# Patient Record
Sex: Male | Born: 1940 | Race: Black or African American | Hispanic: No
Health system: Southern US, Community
[De-identification: ages and names within clinical notes are randomized; demographics above are authoritative.]

---

## 2003-10-12 ENCOUNTER — Other Ambulatory Visit: Payer: Self-pay

## 2010-05-28 ENCOUNTER — Emergency Department: Payer: Self-pay | Admitting: Emergency Medicine

## 2010-09-18 ENCOUNTER — Emergency Department: Payer: Self-pay | Admitting: Emergency Medicine

## 2011-01-01 ENCOUNTER — Ambulatory Visit: Payer: Self-pay | Admitting: Oncology

## 2011-01-05 ENCOUNTER — Emergency Department: Payer: Self-pay | Admitting: Emergency Medicine

## 2011-01-22 ENCOUNTER — Inpatient Hospital Stay: Payer: Self-pay | Admitting: Internal Medicine

## 2011-01-31 ENCOUNTER — Ambulatory Visit: Payer: Self-pay | Admitting: Oncology

## 2011-02-04 DIAGNOSIS — I10 Essential (primary) hypertension: Secondary | ICD-10-CM

## 2011-02-04 DIAGNOSIS — E039 Hypothyroidism, unspecified: Secondary | ICD-10-CM

## 2011-02-04 DIAGNOSIS — E871 Hypo-osmolality and hyponatremia: Secondary | ICD-10-CM

## 2011-02-04 DIAGNOSIS — D649 Anemia, unspecified: Secondary | ICD-10-CM

## 2011-03-03 ENCOUNTER — Ambulatory Visit: Payer: Self-pay | Admitting: Oncology

## 2011-05-12 ENCOUNTER — Ambulatory Visit: Payer: Medicare Other | Admitting: Internal Medicine

## 2011-10-01 ENCOUNTER — Ambulatory Visit: Payer: Self-pay | Admitting: Ophthalmology

## 2011-10-01 DIAGNOSIS — I499 Cardiac arrhythmia, unspecified: Secondary | ICD-10-CM

## 2011-10-01 LAB — HEMOGLOBIN: HGB: 11.4 g/dL — ABNORMAL LOW (ref 13.0–18.0)

## 2011-10-12 ENCOUNTER — Ambulatory Visit: Payer: Self-pay | Admitting: Ophthalmology

## 2011-10-26 ENCOUNTER — Other Ambulatory Visit: Payer: Medicare Other

## 2011-11-24 ENCOUNTER — Other Ambulatory Visit: Payer: Medicare Other

## 2012-01-19 ENCOUNTER — Emergency Department: Payer: Self-pay | Admitting: Emergency Medicine

## 2012-01-19 LAB — COMPREHENSIVE METABOLIC PANEL
Albumin: 3.9 g/dL (ref 3.4–5.0)
Anion Gap: 13 (ref 7–16)
BUN: 18 mg/dL (ref 7–18)
Bilirubin,Total: 2.3 mg/dL — ABNORMAL HIGH (ref 0.2–1.0)
Co2: 22 mmol/L (ref 21–32)
Creatinine: 1.24 mg/dL (ref 0.60–1.30)
EGFR (Non-African Amer.): 59 — ABNORMAL LOW
Glucose: 187 mg/dL — ABNORMAL HIGH (ref 65–99)
SGPT (ALT): 11 U/L — ABNORMAL LOW
Sodium: 132 mmol/L — ABNORMAL LOW (ref 136–145)
Total Protein: 7.6 g/dL (ref 6.4–8.2)

## 2012-01-19 LAB — URINALYSIS, COMPLETE
Bacteria: NONE SEEN
Blood: NEGATIVE
Leukocyte Esterase: NEGATIVE
Nitrite: NEGATIVE
Ph: 5 (ref 4.5–8.0)
Protein: NEGATIVE
Squamous Epithelial: 1
WBC UR: <1 /HPF (ref 0–5)

## 2012-01-19 LAB — CBC
HCT: 35.7 % — ABNORMAL LOW (ref 40.0–52.0)
MCHC: 35.6 g/dL (ref 32.0–36.0)
Platelet: 161 10*3/uL (ref 150–440)
RBC: 4.11 10*6/uL — ABNORMAL LOW (ref 4.40–5.90)
RDW: 14.4 % (ref 11.5–14.5)
WBC: 6.5 10*3/uL (ref 3.8–10.6)

## 2012-09-02 ENCOUNTER — Ambulatory Visit: Payer: Self-pay | Admitting: Internal Medicine

## 2012-09-26 ENCOUNTER — Inpatient Hospital Stay: Payer: Self-pay | Admitting: Internal Medicine

## 2012-09-26 LAB — CBC
HCT: 41 % (ref 40.0–52.0)
MCH: 30.4 pg (ref 26.0–34.0)
MCV: 88 fL (ref 80–100)
Platelet: 285 10*3/uL (ref 150–440)
RBC: 4.65 10*6/uL (ref 4.40–5.90)
RDW: 14 % (ref 11.5–14.5)

## 2012-09-26 LAB — COMPREHENSIVE METABOLIC PANEL
Albumin: 4 g/dL (ref 3.4–5.0)
Anion Gap: 20 — ABNORMAL HIGH (ref 7–16)
Bilirubin,Total: 2.4 mg/dL — ABNORMAL HIGH (ref 0.2–1.0)
Calcium, Total: 9.7 mg/dL (ref 8.5–10.1)
Chloride: 97 mmol/L — ABNORMAL LOW (ref 98–107)
Co2: 16 mmol/L — ABNORMAL LOW (ref 21–32)
EGFR (African American): 31 — ABNORMAL LOW
EGFR (Non-African Amer.): 26 — ABNORMAL LOW
Glucose: 80 mg/dL (ref 65–99)
Osmolality: 276 (ref 275–301)
SGOT(AST): 68 U/L — ABNORMAL HIGH (ref 15–37)
Sodium: 133 mmol/L — ABNORMAL LOW (ref 136–145)

## 2012-09-26 LAB — TROPONIN I: Troponin-I: <0.02 ng/mL

## 2012-09-26 LAB — APTT: Activated PTT: 34.1 secs (ref 23.6–35.9)

## 2012-09-26 LAB — MAGNESIUM: Magnesium: 2.1 mg/dL

## 2012-09-27 LAB — CBC WITH DIFFERENTIAL/PLATELET
Basophil #: 0 10*3/uL (ref 0.0–0.1)
Basophil %: 0.1 %
HCT: 38.3 % — ABNORMAL LOW (ref 40.0–52.0)
Lymphocyte #: 0.2 10*3/uL — ABNORMAL LOW (ref 1.0–3.6)
MCH: 30 pg (ref 26.0–34.0)
MCHC: 34.8 g/dL (ref 32.0–36.0)
Monocyte #: 0.6 x10 3/mm (ref 0.2–1.0)
Monocyte %: 4.1 %
Neutrophil #: 13.1 10*3/uL — ABNORMAL HIGH (ref 1.4–6.5)
Platelet: 195 10*3/uL (ref 150–440)
RBC: 4.44 10*6/uL (ref 4.40–5.90)
RDW: 14.2 % (ref 11.5–14.5)
WBC: 13.9 10*3/uL — ABNORMAL HIGH (ref 3.8–10.6)

## 2012-09-27 LAB — URINALYSIS, COMPLETE
Glucose,UR: NEGATIVE mg/dL (ref 0–75)
Granular Cast: 5
Hyaline Cast: 3
Ketone: NEGATIVE
Nitrite: NEGATIVE
Protein: 30
Specific Gravity: 1.011 (ref 1.003–1.030)
Squamous Epithelial: <1
WBC UR: NONE SEEN /HPF (ref 0–5)

## 2012-09-27 LAB — COMPREHENSIVE METABOLIC PANEL
Albumin: 3.4 g/dL (ref 3.4–5.0)
Alkaline Phosphatase: 90 U/L (ref 50–136)
BUN: 55 mg/dL — ABNORMAL HIGH (ref 7–18)
Calcium, Total: 7.9 mg/dL — ABNORMAL LOW (ref 8.5–10.1)
Chloride: 105 mmol/L (ref 98–107)
Co2: 15 mmol/L — ABNORMAL LOW (ref 21–32)
Creatinine: 2.83 mg/dL — ABNORMAL HIGH (ref 0.60–1.30)
EGFR (African American): 25 — ABNORMAL LOW
EGFR (Non-African Amer.): 22 — ABNORMAL LOW
Glucose: 108 mg/dL — ABNORMAL HIGH (ref 65–99)
Osmolality: 289 (ref 275–301)
Potassium: 4.7 mmol/L (ref 3.5–5.1)
SGOT(AST): 173 U/L — ABNORMAL HIGH (ref 15–37)
SGPT (ALT): 38 U/L (ref 12–78)
Sodium: 137 mmol/L (ref 136–145)
Total Protein: 6.6 g/dL (ref 6.4–8.2)

## 2012-09-27 LAB — PROTIME-INR
INR: 1.4
Prothrombin Time: 17.1 secs — ABNORMAL HIGH (ref 11.5–14.7)

## 2012-09-27 LAB — APTT: Activated PTT: 116.9 secs — ABNORMAL HIGH (ref 23.6–35.9)

## 2012-09-27 LAB — TSH: Thyroid Stimulating Horm: 3.75 u[IU]/mL

## 2012-09-27 LAB — MAGNESIUM: Magnesium: 1.9 mg/dL

## 2012-09-27 LAB — PHOSPHORUS: Phosphorus: 5.6 mg/dL — ABNORMAL HIGH (ref 2.5–4.9)

## 2012-09-27 LAB — CK: CK, Total: 1836 U/L — ABNORMAL HIGH (ref 35–232)

## 2012-09-28 LAB — CBC WITH DIFFERENTIAL/PLATELET
Basophil #: 0 10*3/uL (ref 0.0–0.1)
Basophil %: 0.3 %
Eosinophil #: 0 10*3/uL (ref 0.0–0.7)
Eosinophil %: 0 %
HCT: 35.5 % — ABNORMAL LOW (ref 40.0–52.0)
HGB: 12.3 g/dL — ABNORMAL LOW (ref 13.0–18.0)
Lymphocyte #: 0.1 10*3/uL — ABNORMAL LOW (ref 1.0–3.6)
Lymphocyte %: 1.1 %
MCH: 30 pg (ref 26.0–34.0)
MCHC: 34.6 g/dL (ref 32.0–36.0)
MCV: 87 fL (ref 80–100)
Monocyte #: 0.6 x10 3/mm (ref 0.2–1.0)
Monocyte %: 4.3 %
Neutrophil #: 12.4 10*3/uL — ABNORMAL HIGH (ref 1.4–6.5)
Neutrophil %: 94.3 %
Platelet: 157 10*3/uL (ref 150–440)
RBC: 4.09 10*6/uL — ABNORMAL LOW (ref 4.40–5.90)
RDW: 14.3 % (ref 11.5–14.5)
WBC: 13.2 10*3/uL — ABNORMAL HIGH (ref 3.8–10.6)

## 2012-09-28 LAB — COMPREHENSIVE METABOLIC PANEL
Albumin: 2.8 g/dL — ABNORMAL LOW (ref 3.4–5.0)
Alkaline Phosphatase: 75 U/L (ref 50–136)
Anion Gap: 13 (ref 7–16)
BUN: 49 mg/dL — ABNORMAL HIGH (ref 7–18)
Bilirubin,Total: 0.7 mg/dL (ref 0.2–1.0)
Calcium, Total: 7.4 mg/dL — ABNORMAL LOW (ref 8.5–10.1)
Chloride: 104 mmol/L (ref 98–107)
Co2: 19 mmol/L — ABNORMAL LOW (ref 21–32)
Creatinine: 2.25 mg/dL — ABNORMAL HIGH (ref 0.60–1.30)
EGFR (African American): 33 — ABNORMAL LOW
EGFR (Non-African Amer.): 28 — ABNORMAL LOW
Glucose: 175 mg/dL — ABNORMAL HIGH (ref 65–99)
Osmolality: 289 (ref 275–301)
Potassium: 4 mmol/L (ref 3.5–5.1)
SGOT(AST): 87 U/L — ABNORMAL HIGH (ref 15–37)
SGPT (ALT): 32 U/L (ref 12–78)
Sodium: 136 mmol/L (ref 136–145)
Total Protein: 6.3 g/dL — ABNORMAL LOW (ref 6.4–8.2)

## 2012-09-28 LAB — APTT
Activated PTT: 67.6 secs — ABNORMAL HIGH (ref 23.6–35.9)
Activated PTT: 32.8 secs (ref 23.6–35.9)

## 2012-09-28 LAB — CLOSTRIDIUM DIFFICILE BY PCR

## 2012-09-28 LAB — HEMATOCRIT: HCT: 35 % — ABNORMAL LOW (ref 40.0–52.0)

## 2012-09-29 LAB — CBC WITH DIFFERENTIAL/PLATELET
Basophil #: 0 10*3/uL (ref 0.0–0.1)
Basophil %: 0.2 %
Eosinophil #: 0 10*3/uL (ref 0.0–0.7)
Eosinophil %: 0 %
HCT: 28.3 % — ABNORMAL LOW (ref 40.0–52.0)
Lymphocyte #: 0.1 10*3/uL — ABNORMAL LOW (ref 1.0–3.6)
MCH: 30.3 pg (ref 26.0–34.0)
MCHC: 35.9 g/dL (ref 32.0–36.0)
MCV: 84 fL (ref 80–100)
Monocyte %: 5.3 %
Neutrophil #: 8.3 10*3/uL — ABNORMAL HIGH (ref 1.4–6.5)
Neutrophil %: 93.3 %
RDW: 14.3 % (ref 11.5–14.5)
WBC: 8.9 10*3/uL (ref 3.8–10.6)

## 2012-09-29 LAB — HEPATIC FUNCTION PANEL A (ARMC)
Alkaline Phosphatase: 66 U/L (ref 50–136)
Bilirubin, Direct: 0.4 mg/dL — ABNORMAL HIGH (ref 0.00–0.20)
Bilirubin,Total: 0.6 mg/dL (ref 0.2–1.0)
SGOT(AST): 60 U/L — ABNORMAL HIGH (ref 15–37)
SGPT (ALT): 35 U/L (ref 12–78)
Total Protein: 5.8 g/dL — ABNORMAL LOW (ref 6.4–8.2)

## 2012-09-29 LAB — BASIC METABOLIC PANEL
BUN: 42 mg/dL — ABNORMAL HIGH (ref 7–18)
Calcium, Total: 7.5 mg/dL — ABNORMAL LOW (ref 8.5–10.1)
Creatinine: 1.66 mg/dL — ABNORMAL HIGH (ref 0.60–1.30)
EGFR (African American): 48 — ABNORMAL LOW
Glucose: 130 mg/dL — ABNORMAL HIGH (ref 65–99)
Osmolality: 290 (ref 275–301)
Potassium: 3.4 mmol/L — ABNORMAL LOW (ref 3.5–5.1)
Sodium: 139 mmol/L (ref 136–145)

## 2012-09-29 LAB — APTT
Activated PTT: 44.5 secs — ABNORMAL HIGH (ref 23.6–35.9)
Activated PTT: 61.5 secs — ABNORMAL HIGH (ref 23.6–35.9)

## 2012-09-30 ENCOUNTER — Ambulatory Visit: Payer: Self-pay | Admitting: Internal Medicine

## 2012-09-30 LAB — CBC WITH DIFFERENTIAL/PLATELET
Basophil %: 0 %
Eosinophil #: 0 10*3/uL (ref 0.0–0.7)
Eosinophil %: 0 %
HCT: 28.5 % — ABNORMAL LOW (ref 40.0–52.0)
Lymphocyte %: 1.3 %
MCH: 30.8 pg (ref 26.0–34.0)
MCV: 85 fL (ref 80–100)
Monocyte %: 4.3 %
Neutrophil %: 94.4 %
RBC: 3.37 10*6/uL — ABNORMAL LOW (ref 4.40–5.90)
RDW: 14.1 % (ref 11.5–14.5)

## 2012-09-30 LAB — COMPREHENSIVE METABOLIC PANEL
Alkaline Phosphatase: 64 U/L (ref 50–136)
Anion Gap: 9 (ref 7–16)
BUN: 27 mg/dL — ABNORMAL HIGH (ref 7–18)
Bilirubin,Total: 0.7 mg/dL (ref 0.2–1.0)
Calcium, Total: 8 mg/dL — ABNORMAL LOW (ref 8.5–10.1)
Chloride: 109 mmol/L — ABNORMAL HIGH (ref 98–107)
Co2: 22 mmol/L (ref 21–32)
Glucose: 115 mg/dL — ABNORMAL HIGH (ref 65–99)
Osmolality: 285 (ref 275–301)
Potassium: 3.3 mmol/L — ABNORMAL LOW (ref 3.5–5.1)
SGOT(AST): 40 U/L — ABNORMAL HIGH (ref 15–37)
SGPT (ALT): 38 U/L (ref 12–78)
Total Protein: 5.8 g/dL — ABNORMAL LOW (ref 6.4–8.2)

## 2012-09-30 LAB — APTT: Activated PTT: 95.5 secs — ABNORMAL HIGH (ref 23.6–35.9)

## 2012-09-30 LAB — AMMONIA: Ammonia, Plasma: <25 mcmol/L (ref 11–32)

## 2012-10-01 LAB — BASIC METABOLIC PANEL
Co2: 28 mmol/L (ref 21–32)
Creatinine: 1.38 mg/dL — ABNORMAL HIGH (ref 0.60–1.30)
EGFR (African American): 60 — ABNORMAL LOW
EGFR (Non-African Amer.): 51 — ABNORMAL LOW
Osmolality: 293 (ref 275–301)
Sodium: 145 mmol/L (ref 136–145)

## 2012-10-01 LAB — CBC WITH DIFFERENTIAL/PLATELET
Eosinophil %: 0 %
HGB: 10.9 g/dL — ABNORMAL LOW (ref 13.0–18.0)
Lymphocyte %: 6.1 %
MCH: 30.4 pg (ref 26.0–34.0)
MCV: 86 fL (ref 80–100)
Monocyte #: 0.9 x10 3/mm (ref 0.2–1.0)
Neutrophil %: 82.6 %
RDW: 14.4 % (ref 11.5–14.5)
WBC: 8.5 10*3/uL (ref 3.8–10.6)

## 2012-10-01 LAB — APTT
Activated PTT: 120 secs — ABNORMAL HIGH (ref 23.6–35.9)
Activated PTT: 80.7 secs — ABNORMAL HIGH (ref 23.6–35.9)

## 2012-10-02 LAB — CBC WITH DIFFERENTIAL/PLATELET
Basophil #: 0 10*3/uL
Basophil %: 0.1 %
Eosinophil #: 0 10*3/uL
Eosinophil %: 0 %
HCT: 30.8 % — ABNORMAL LOW
HGB: 10.9 g/dL — ABNORMAL LOW
Lymphocyte %: 8.1 %
Lymphs Abs: 0.6 10*3/uL — ABNORMAL LOW
MCH: 30 pg
MCHC: 35.4 g/dL
MCV: 85 fL
Monocyte #: 1.1 10*3/uL — ABNORMAL HIGH
Monocyte %: 14.1 %
Neutrophil #: 6 10*3/uL
Neutrophil %: 77.7 %
Platelet: 181 10*3/uL
RBC: 3.63 x10 6/mm 3 — ABNORMAL LOW
RDW: 14.1 %
WBC: 7.7 10*3/uL

## 2012-10-02 LAB — APTT
Activated PTT: 147.5 secs — ABNORMAL HIGH (ref 23.6–35.9)
Activated PTT: 62.6 s — ABNORMAL HIGH
Activated PTT: 96.9 s — ABNORMAL HIGH

## 2012-10-02 LAB — BASIC METABOLIC PANEL WITH GFR
Anion Gap: 6 — ABNORMAL LOW
BUN: 21 mg/dL — ABNORMAL HIGH
Calcium, Total: 7.8 mg/dL — ABNORMAL LOW
Chloride: 111 mmol/L — ABNORMAL HIGH
Co2: 26 mmol/L
Creatinine: 1.17 mg/dL
EGFR (African American): >60
EGFR (Non-African Amer.): >60
Glucose: 105 mg/dL — ABNORMAL HIGH
Osmolality: 288
Potassium: 3.3 mmol/L — ABNORMAL LOW
Sodium: 143 mmol/L

## 2012-10-02 LAB — CULTURE, BLOOD (SINGLE)

## 2012-10-03 LAB — BASIC METABOLIC PANEL
Anion Gap: 3 — ABNORMAL LOW (ref 7–16)
BUN: 18 mg/dL (ref 7–18)
Calcium, Total: 7.4 mg/dL — ABNORMAL LOW (ref 8.5–10.1)
EGFR (Non-African Amer.): 56 — ABNORMAL LOW
Glucose: 90 mg/dL (ref 65–99)
Osmolality: 283 (ref 275–301)
Sodium: 141 mmol/L (ref 136–145)

## 2012-10-03 LAB — CBC WITH DIFFERENTIAL/PLATELET
Basophil %: 0.3 %
Lymphocyte #: 0.8 10*3/uL — ABNORMAL LOW (ref 1.0–3.6)
MCH: 30 pg (ref 26.0–34.0)
Neutrophil #: 8.2 10*3/uL — ABNORMAL HIGH (ref 1.4–6.5)
RBC: 3.63 10*6/uL — ABNORMAL LOW (ref 4.40–5.90)
RDW: 14.3 % (ref 11.5–14.5)
WBC: 10.3 10*3/uL (ref 3.8–10.6)

## 2012-10-03 LAB — MAGNESIUM: Magnesium: 1.5 mg/dL — ABNORMAL LOW

## 2012-10-03 LAB — APTT: Activated PTT: 143 secs — ABNORMAL HIGH (ref 23.6–35.9)

## 2012-10-04 LAB — HEMATOCRIT: HCT: 31.2 % — ABNORMAL LOW (ref 40.0–52.0)

## 2012-10-04 LAB — BASIC METABOLIC PANEL
BUN: 15 mg/dL (ref 7–18)
Chloride: 107 mmol/L (ref 98–107)
Creatinine: 1.11 mg/dL (ref 0.60–1.30)
Potassium: 3.5 mmol/L (ref 3.5–5.1)

## 2012-10-04 LAB — WBC: WBC: 16.3 10*3/uL — ABNORMAL HIGH (ref 3.8–10.6)

## 2012-10-06 DIAGNOSIS — I10 Essential (primary) hypertension: Secondary | ICD-10-CM

## 2012-10-06 DIAGNOSIS — F068 Other specified mental disorders due to known physiological condition: Secondary | ICD-10-CM

## 2012-10-06 DIAGNOSIS — F05 Delirium due to known physiological condition: Secondary | ICD-10-CM

## 2012-10-06 DIAGNOSIS — E279 Disorder of adrenal gland, unspecified: Secondary | ICD-10-CM

## 2012-10-31 ENCOUNTER — Ambulatory Visit: Payer: Self-pay | Admitting: Internal Medicine

## 2013-01-19 ENCOUNTER — Inpatient Hospital Stay: Payer: Self-pay | Admitting: Family Medicine

## 2013-01-19 LAB — TROPONIN I: Troponin-I: <0.02 ng/mL

## 2013-01-19 LAB — CBC
Platelet: 297 10*3/uL (ref 150–440)
RBC: 3.86 10*6/uL — ABNORMAL LOW (ref 4.40–5.90)
RDW: 15.6 % — ABNORMAL HIGH (ref 11.5–14.5)

## 2013-01-19 LAB — URINALYSIS, COMPLETE
Glucose,UR: NEGATIVE mg/dL (ref 0–75)
Granular Cast: 7
Hyaline Cast: 20
Leukocyte Esterase: NEGATIVE
Nitrite: NEGATIVE
Ph: 5 (ref 4.5–8.0)
Protein: 30
RBC,UR: 1 /HPF (ref 0–5)
WBC UR: 2 /HPF (ref 0–5)

## 2013-01-19 LAB — COMPREHENSIVE METABOLIC PANEL
Albumin: 3.2 g/dL — ABNORMAL LOW (ref 3.4–5.0)
Anion Gap: 10 (ref 7–16)
BUN: 14 mg/dL (ref 7–18)
Bilirubin,Total: 2.1 mg/dL — ABNORMAL HIGH (ref 0.2–1.0)
Calcium, Total: 9.1 mg/dL (ref 8.5–10.1)
Chloride: 101 mmol/L (ref 98–107)
Co2: 23 mmol/L (ref 21–32)
Creatinine: 1.26 mg/dL (ref 0.60–1.30)
EGFR (African American): >60
EGFR (Non-African Amer.): 57 — ABNORMAL LOW
Glucose: 64 mg/dL — ABNORMAL LOW (ref 65–99)
Osmolality: 267 (ref 275–301)
Potassium: 4.1 mmol/L (ref 3.5–5.1)
SGOT(AST): 23 U/L (ref 15–37)
Total Protein: 7.2 g/dL (ref 6.4–8.2)

## 2013-01-19 LAB — CK TOTAL AND CKMB (NOT AT ARMC)
CK, Total: 203 U/L (ref 35–232)
CK-MB: 1.3 ng/mL (ref 0.5–3.6)

## 2013-01-20 LAB — BASIC METABOLIC PANEL
Anion Gap: 3 — ABNORMAL LOW (ref 7–16)
BUN: 13 mg/dL (ref 7–18)
Co2: 27 mmol/L (ref 21–32)
Creatinine: 1.21 mg/dL (ref 0.60–1.30)
EGFR (African American): >60
EGFR (Non-African Amer.): 60 — ABNORMAL LOW
Potassium: 4.4 mmol/L (ref 3.5–5.1)

## 2013-01-20 LAB — TSH: Thyroid Stimulating Horm: 2.71 u[IU]/mL

## 2013-01-21 LAB — BASIC METABOLIC PANEL
Anion Gap: 5 — ABNORMAL LOW (ref 7–16)
BUN: 15 mg/dL (ref 7–18)
Chloride: 108 mmol/L — ABNORMAL HIGH (ref 98–107)
EGFR (Non-African Amer.): >60
Sodium: 141 mmol/L (ref 136–145)

## 2013-01-22 LAB — BASIC METABOLIC PANEL
Anion Gap: 5 — ABNORMAL LOW (ref 7–16)
Chloride: 104 mmol/L (ref 98–107)
Creatinine: 1.03 mg/dL (ref 0.60–1.30)
EGFR (African American): >60
EGFR (Non-African Amer.): >60
Glucose: 77 mg/dL (ref 65–99)
Potassium: 4.4 mmol/L (ref 3.5–5.1)
Sodium: 139 mmol/L (ref 136–145)

## 2013-01-23 LAB — PLATELET COUNT: Platelet: 238 10*3/uL (ref 150–440)

## 2013-01-23 LAB — BASIC METABOLIC PANEL
Anion Gap: 7 (ref 7–16)
BUN: 17 mg/dL (ref 7–18)
Calcium, Total: 8.3 mg/dL — ABNORMAL LOW (ref 8.5–10.1)
Chloride: 103 mmol/L (ref 98–107)
Co2: 28 mmol/L (ref 21–32)
EGFR (African American): >60
EGFR (Non-African Amer.): >60

## 2013-05-21 IMAGING — CT CT HEAD WITHOUT CONTRAST
2 series · 15 of 30 positions shown, 19 images · non-contrast
Comparison: none

REASON FOR EXAM: CONFUSION
COMMENTS:

PROCEDURE:     CT  - CT HEAD WITHOUT CONTRAST  - September 26, 2012  [DATE]
RESULT:     Comparison:  None
TECHNIQUE: Multiple axial images from the foramen magnum to the vertex were
obtained without IV contrast.

[Series 2: without · axial · non-contrast · 0.43mm/px · z∈[-115,+5]mm · 13 of 30 slices shown, 17 images]
[im 3/30  brain]
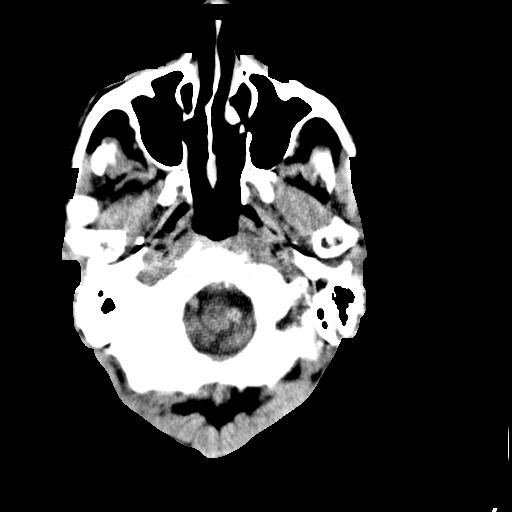
[im 3/30  bone]
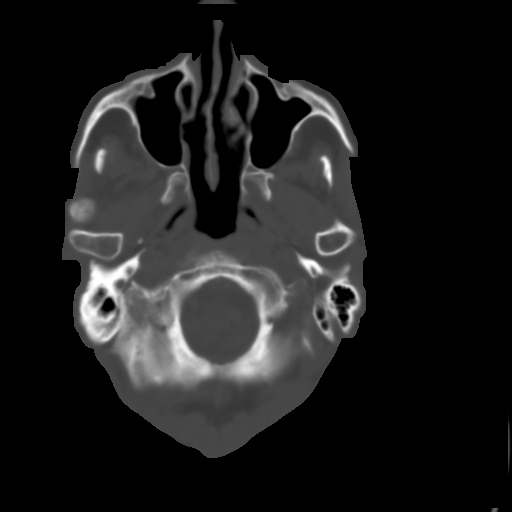
[im 5/30  brain]
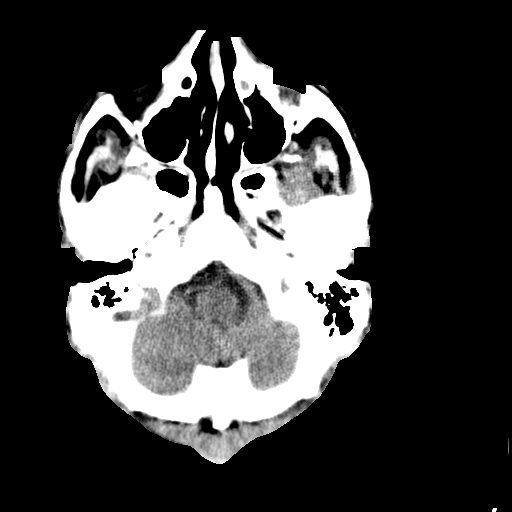
[im 7/30  brain]
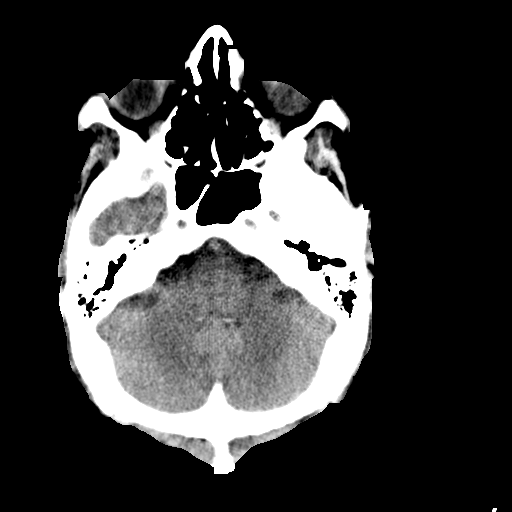
[im 9/30  brain]
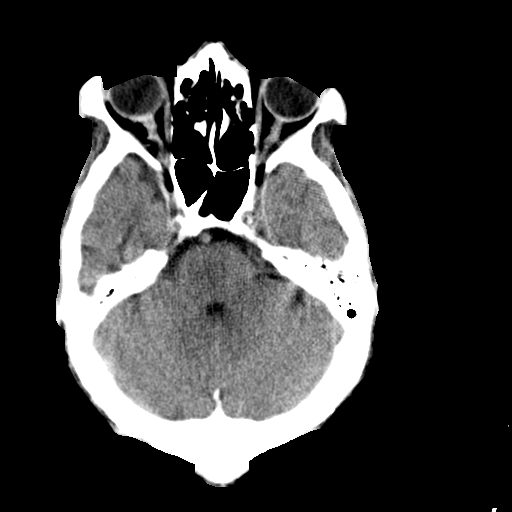
[im 11/30  brain]
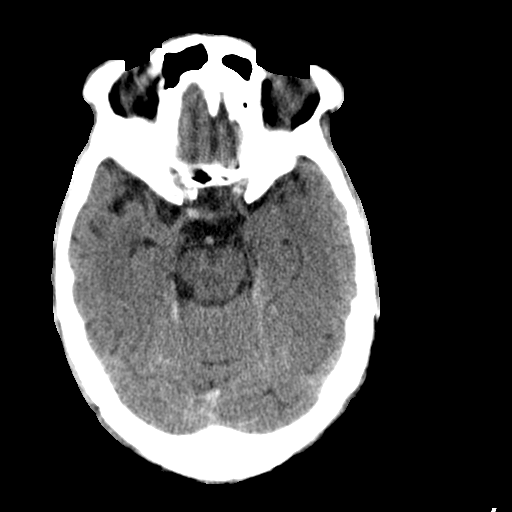
[im 11/30  bone]
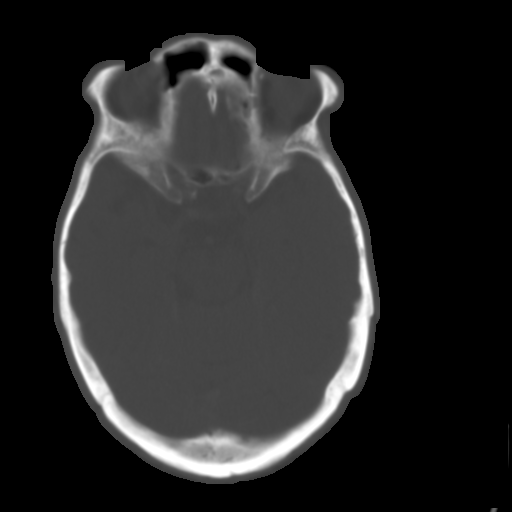
[im 13/30  brain]
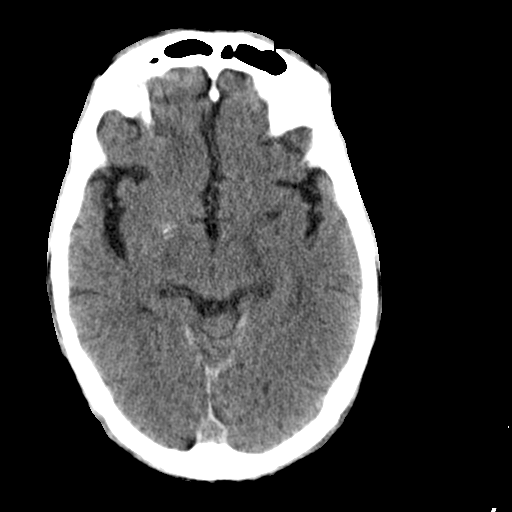
[im 15/30  brain]
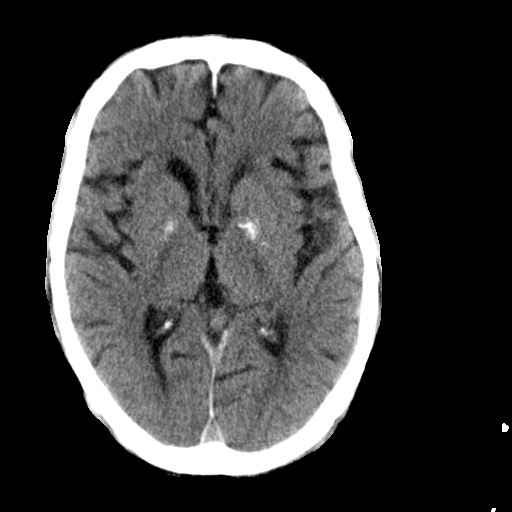
[im 17/30  brain]
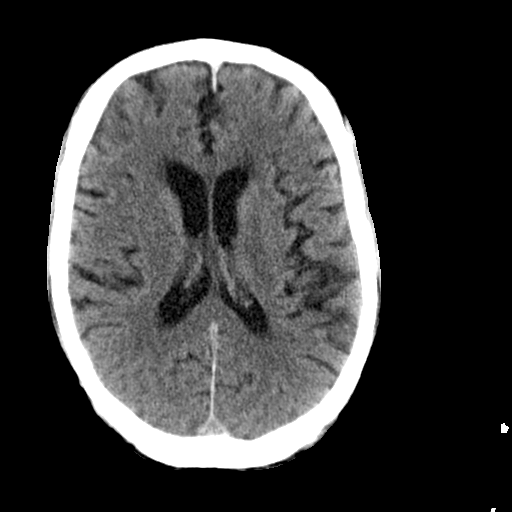
[im 19/30  brain]
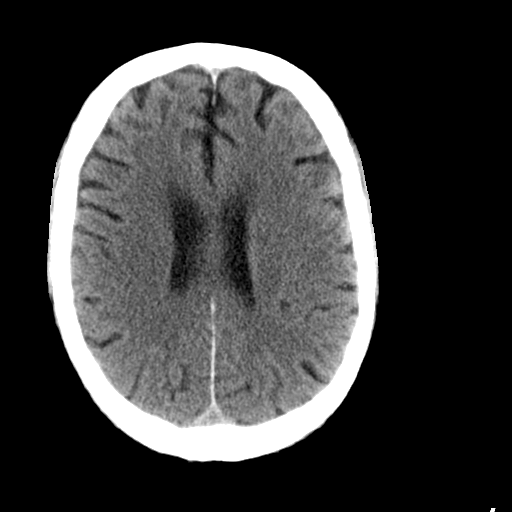
[im 19/30  bone]
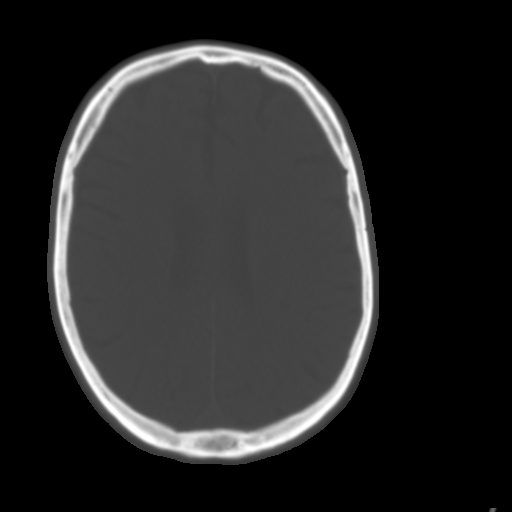
[im 21/30  brain]
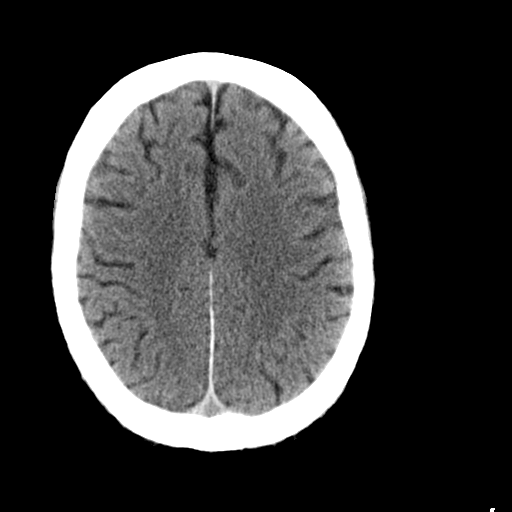
[im 23/30  brain]
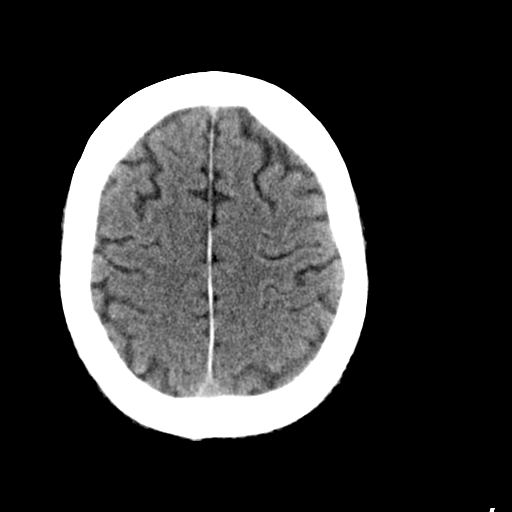
[im 25/30  brain]
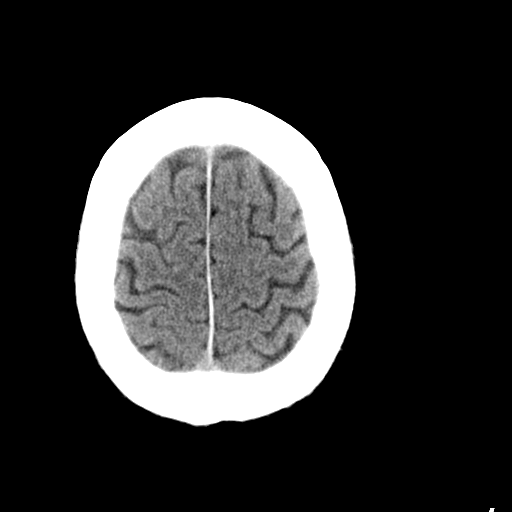
[im 27/30  brain]
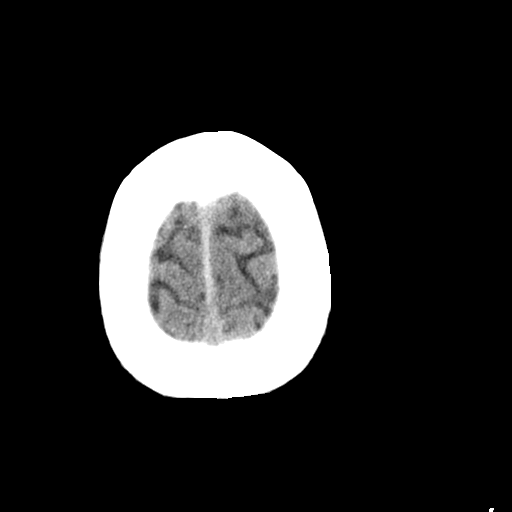
[im 27/30  bone]
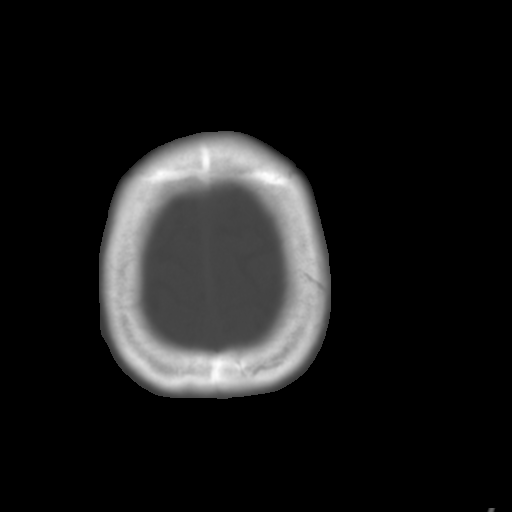

[Series 3: bone · axial · 0.43mm/px · z∈[-115,-95]mm · 2 of 32 slices shown]
[im 3/32  bone]
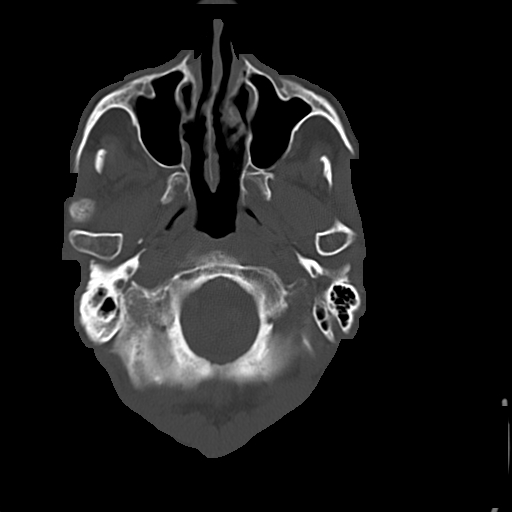
[im 7/32  bone]
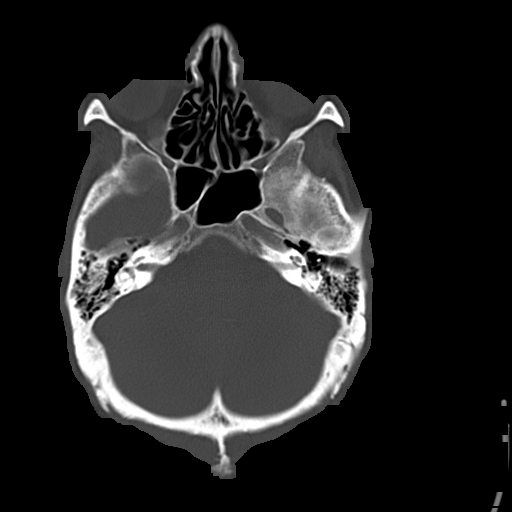

[15 of 30 positions shown; findings below may reference images not displayed]

FINDINGS: There is no evidence of mass effect, midline shift, or extra-axial fluid
collections.  There is no evidence of a space-occupying lesion or
intracranial hemorrhage. There is no evidence of a cortical-based area of
acute infarction. There is generalized cerebral atrophy. There is
periventricular white matter low attenuation likely secondary to
microangiopathy.

The ventricles and sulci are appropriate for the patient's age. The basal
cisterns are patent.

Visualized portions of the orbits are unremarkable. The visualized portions
of the paranasal sinuses and mastoid air cells are unremarkable.
Cerebrovascular atherosclerotic calcifications are noted.

The osseous structures are unremarkable.
IMPRESSION: No acute intracranial process.

[REDACTED]

## 2013-05-25 IMAGING — CR DG ABDOMEN 3V
1 series · 4 of 4 positions shown · non-contrast
Comparison: none

REASON FOR EXAM: ileus on previous films
COMMENTS:

PROCEDURE:     DXR - DXR ABDOMEN COMPLETE  - September 30, 2012  [DATE]
RESULT:     Comparisons:  None

[Series 1: ap · 0.17mm/px · 4 of 4 slices shown]
[im 1/4]
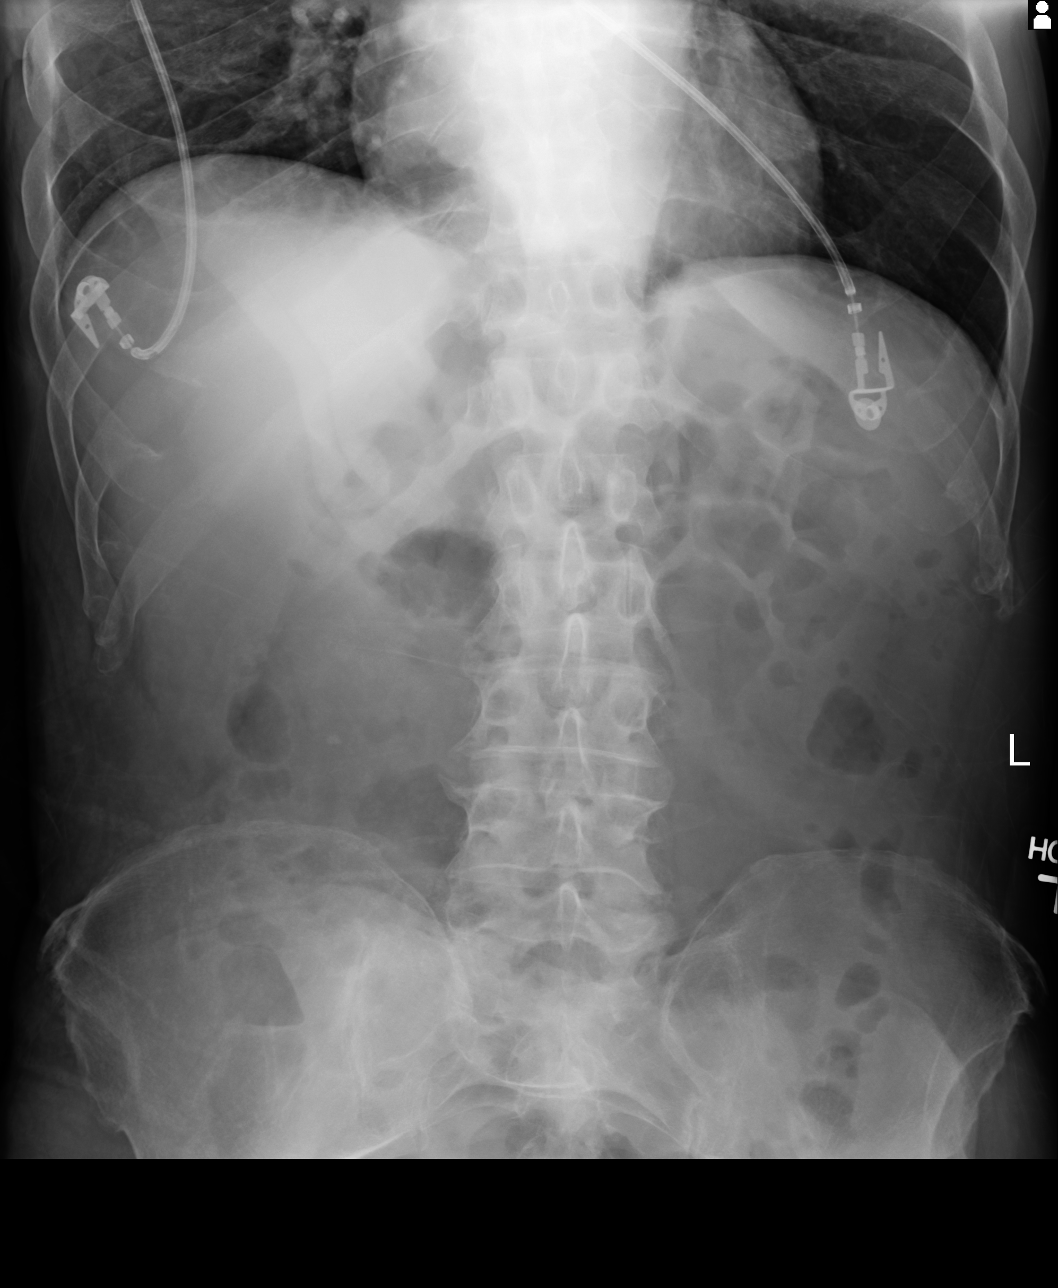
[im 2/4]
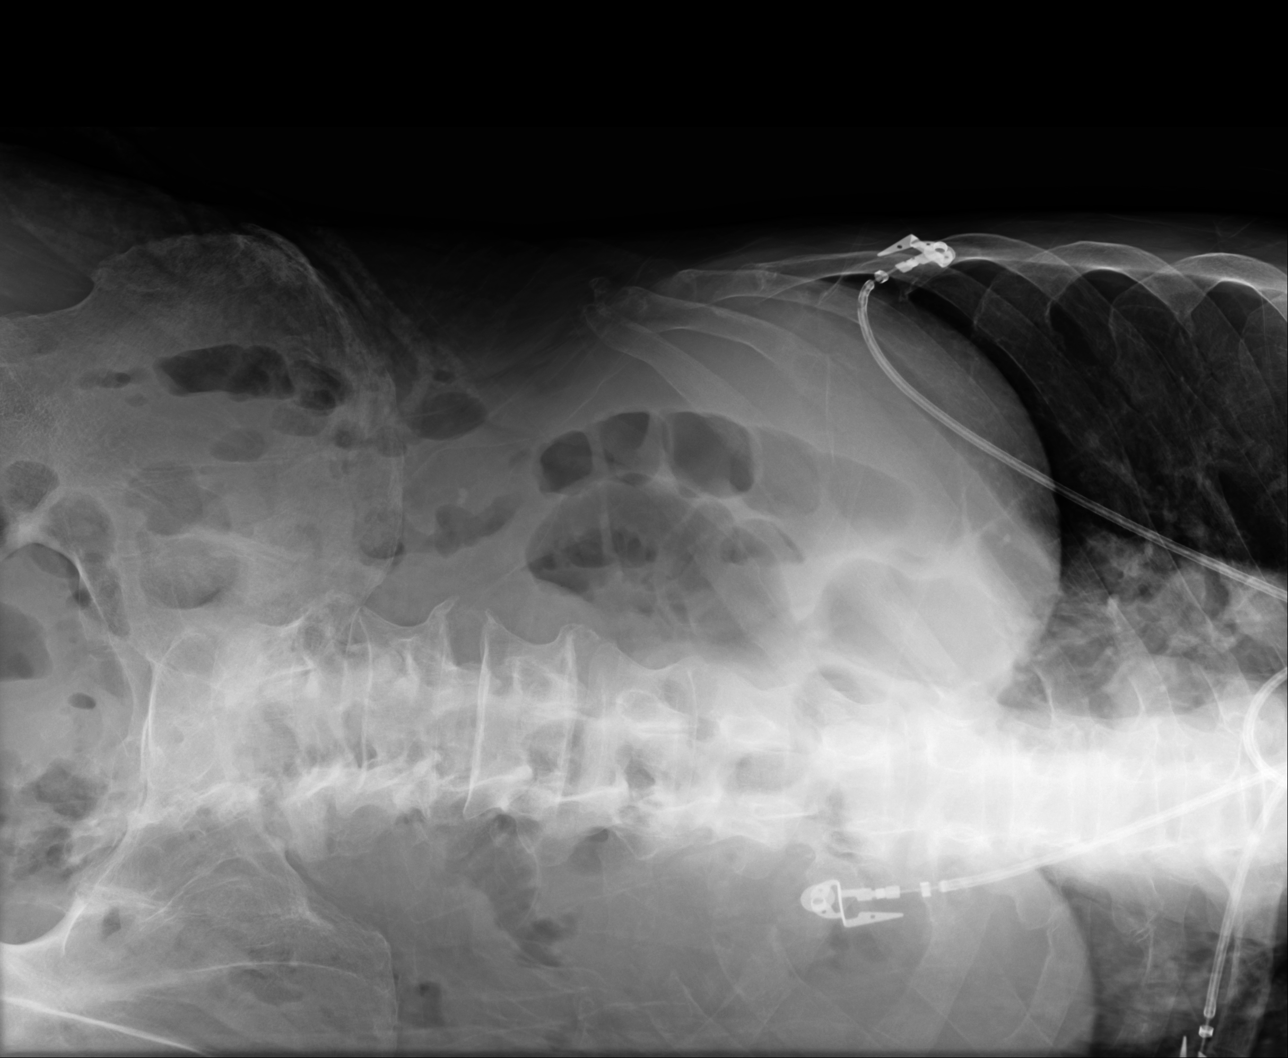
[im 3/4]
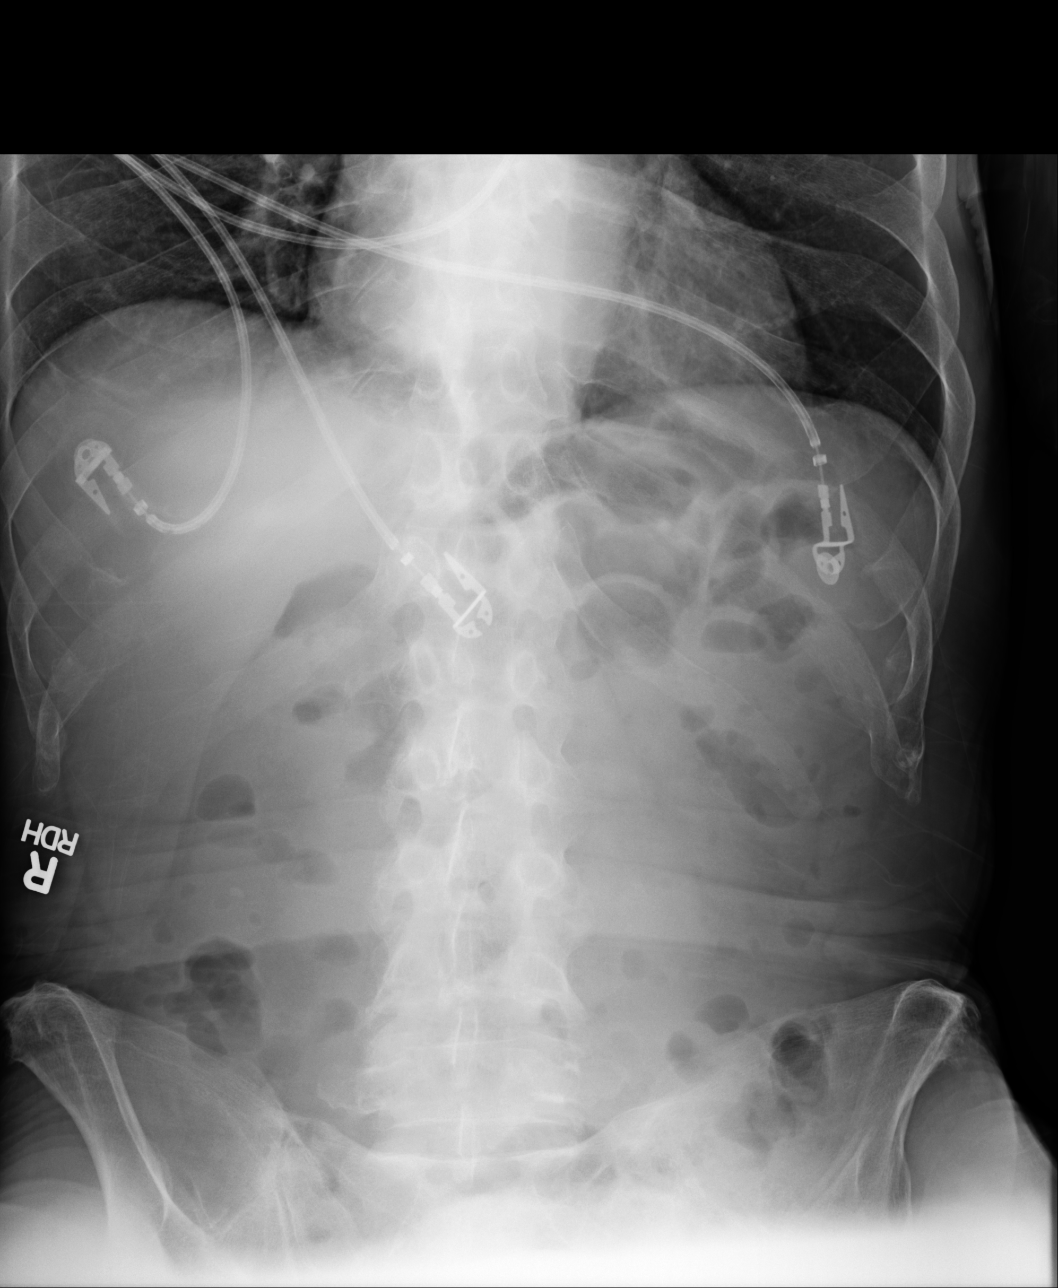
[im 4/4]
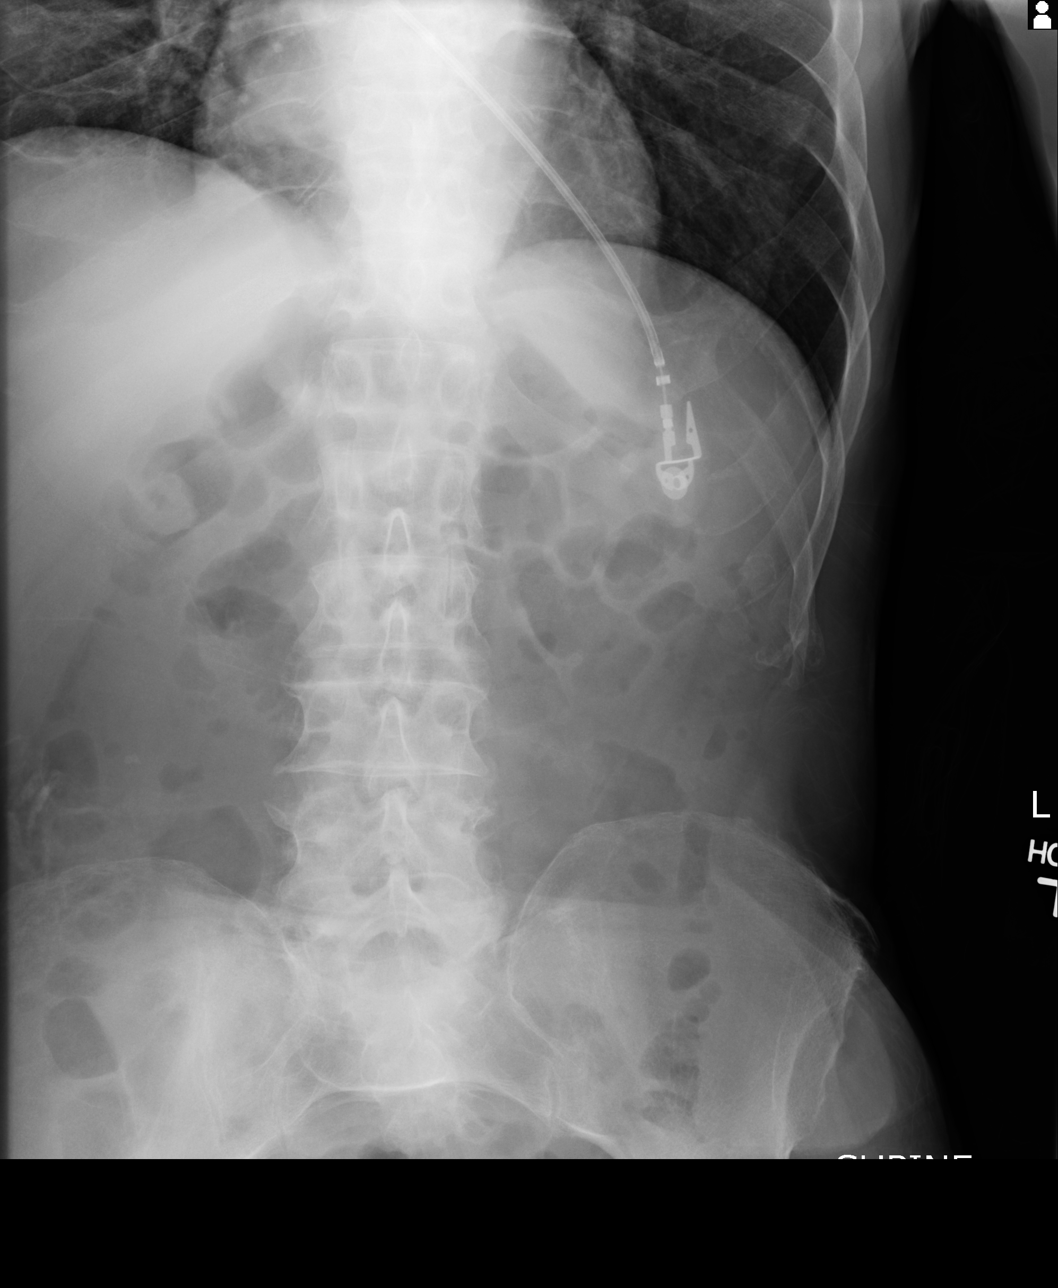

[4 of 4 positions shown; findings below may reference images not displayed]

FINDINGS: Supine, upright, and left lateral decubitus views of the abdomen are
provided.

The chest is clear. The heart and mediastinum are unremarkable. The osseous
structures are unremarkable.

There is a nonspecific bowel gas pattern. There is no bowel dilatation to
suggest obstruction. There are no air-fluid levels. There are no dilated
loops, bowel wall thickening, or evidence of mass effect.  Soft tissue
shadows are normal. There is no evidence of pneumoperitoneum, portal venous
gas, or pneumatosis.

Osseous structures are unremarkable.
IMPRESSION: Unremarkable abdominal series.

[REDACTED]

## 2013-08-16 DIAGNOSIS — E2749 Other adrenocortical insufficiency: Secondary | ICD-10-CM | POA: Diagnosis not present

## 2013-08-30 DIAGNOSIS — E2749 Other adrenocortical insufficiency: Secondary | ICD-10-CM | POA: Diagnosis not present

## 2013-09-13 IMAGING — CR DG CHEST 1V PORT
1 series · 1 of 1 positions shown · non-contrast
Comparison: none

REASON FOR EXAM: weakness
COMMENTS:

[ap]
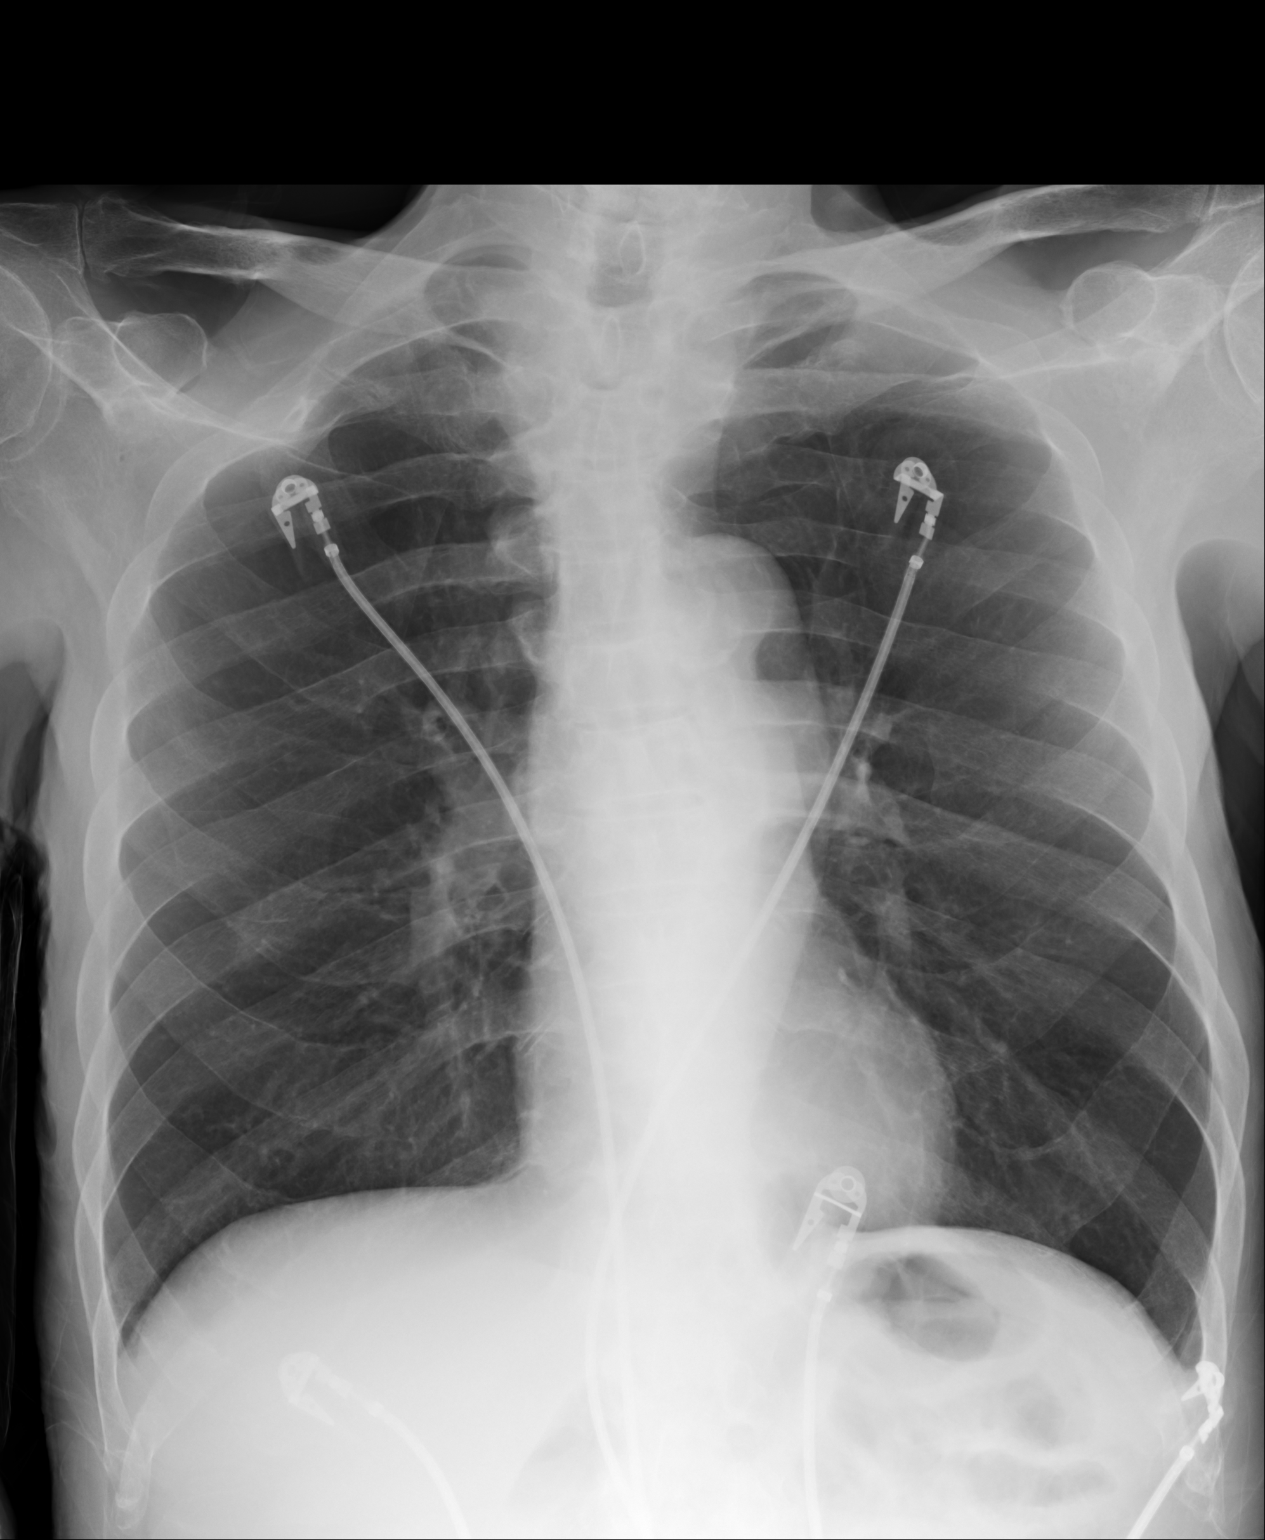

[1 of 1 positions shown; findings below may reference images not displayed]

PROCEDURE:     DXR - DXR PORTABLE CHEST SINGLE VIEW  - January 19, 2013  [DATE]

RESULT:     Comparison is made to the previous examination from 09/26/2012.

The lungs are hyperinflated consistent with COPD. The lungs are clear. The
heart and pulmonary vessels are normal. The bony and mediastinal structures
are unremarkable. There is no effusion. There is no pneumothorax or evidence
of congestive failure.
IMPRESSION: No acute cardiopulmonary disease.

[REDACTED]

## 2013-10-11 DIAGNOSIS — Z136 Encounter for screening for cardiovascular disorders: Secondary | ICD-10-CM | POA: Diagnosis not present

## 2013-10-11 DIAGNOSIS — Z79899 Other long term (current) drug therapy: Secondary | ICD-10-CM | POA: Diagnosis not present

## 2013-10-19 DIAGNOSIS — Z125 Encounter for screening for malignant neoplasm of prostate: Secondary | ICD-10-CM | POA: Diagnosis not present

## 2013-10-19 DIAGNOSIS — Z0000 Encounter for general adult medical examination without abnormal findings: Secondary | ICD-10-CM | POA: Diagnosis not present

## 2013-10-26 DIAGNOSIS — R972 Elevated prostate specific antigen [PSA]: Secondary | ICD-10-CM | POA: Diagnosis not present

## 2013-10-26 DIAGNOSIS — Z79899 Other long term (current) drug therapy: Secondary | ICD-10-CM | POA: Diagnosis not present

## 2013-12-06 DIAGNOSIS — R972 Elevated prostate specific antigen [PSA]: Secondary | ICD-10-CM | POA: Diagnosis not present

## 2013-12-06 DIAGNOSIS — R351 Nocturia: Secondary | ICD-10-CM | POA: Diagnosis not present

## 2013-12-06 DIAGNOSIS — N401 Enlarged prostate with lower urinary tract symptoms: Secondary | ICD-10-CM | POA: Diagnosis not present

## 2013-12-06 DIAGNOSIS — R339 Retention of urine, unspecified: Secondary | ICD-10-CM | POA: Diagnosis not present

## 2014-02-27 DIAGNOSIS — E2749 Other adrenocortical insufficiency: Secondary | ICD-10-CM | POA: Diagnosis not present

## 2014-03-06 DIAGNOSIS — E2749 Other adrenocortical insufficiency: Secondary | ICD-10-CM | POA: Diagnosis not present

## 2014-03-06 DIAGNOSIS — I10 Essential (primary) hypertension: Secondary | ICD-10-CM | POA: Diagnosis not present

## 2014-04-16 DIAGNOSIS — D649 Anemia, unspecified: Secondary | ICD-10-CM | POA: Diagnosis not present

## 2014-04-16 DIAGNOSIS — Z79899 Other long term (current) drug therapy: Secondary | ICD-10-CM | POA: Diagnosis not present

## 2014-04-18 DIAGNOSIS — R972 Elevated prostate specific antigen [PSA]: Secondary | ICD-10-CM | POA: Diagnosis not present

## 2014-04-18 DIAGNOSIS — R339 Retention of urine, unspecified: Secondary | ICD-10-CM | POA: Diagnosis not present

## 2014-04-18 DIAGNOSIS — I10 Essential (primary) hypertension: Secondary | ICD-10-CM | POA: Diagnosis not present

## 2014-04-18 DIAGNOSIS — I509 Heart failure, unspecified: Secondary | ICD-10-CM | POA: Diagnosis not present

## 2014-04-18 DIAGNOSIS — R351 Nocturia: Secondary | ICD-10-CM | POA: Diagnosis not present

## 2014-04-18 DIAGNOSIS — N401 Enlarged prostate with lower urinary tract symptoms: Secondary | ICD-10-CM | POA: Diagnosis not present

## 2014-04-18 DIAGNOSIS — E2749 Other adrenocortical insufficiency: Secondary | ICD-10-CM | POA: Diagnosis not present

## 2014-04-23 DIAGNOSIS — D649 Anemia, unspecified: Secondary | ICD-10-CM | POA: Diagnosis not present

## 2014-04-23 DIAGNOSIS — I10 Essential (primary) hypertension: Secondary | ICD-10-CM | POA: Diagnosis not present

## 2014-07-12 DIAGNOSIS — H2512 Age-related nuclear cataract, left eye: Secondary | ICD-10-CM | POA: Diagnosis not present

## 2014-07-23 DIAGNOSIS — I10 Essential (primary) hypertension: Secondary | ICD-10-CM | POA: Diagnosis not present

## 2014-07-30 DIAGNOSIS — I10 Essential (primary) hypertension: Secondary | ICD-10-CM | POA: Diagnosis not present

## 2014-11-22 NOTE — Discharge Summary (Signed)
PATIENT NAME:  Gary Hahn, Gary Hahn MR#:  [id number] DATE OF BIRTH:  [birth year]  DATE OF ADMISSION:  01/19/2013 DATE OF DISCHARGE:  01/23/2013  DISCHARGE DIAGNOSES: 1.  Adrenal insufficiency.  2.  History of hypertension.  3.  Malnutrition.  4.  Chronic anemia.   DISCHARGE MEDICATIONS: 1.  Hydrocortisone 10 mg p.o. b.i.d.  2.  Ensure Plus 237 mL t.i.d. with meals.   CONSULTANTS:  Endocrinology per Dr. Gabriel Carina.   PROCEDURES:  None.   PERTINENT LABORATORIES:  None.   BRIEF HOSPITAL COURSE:  PROBLEM Adrenal insufficiency.  The patient initially came in with complaints of hyperglycemia and failure-to-thrive who was noted to have underlying adrenal insufficiency, which he has been off his medication for unknown period of time. The last office visit with Dr. Gabriel Carina was in September 2013. He was unable to answer any further questions regarding his treatment and medications. It is suspected that his failure-to-thrive was secondary to anorexia and weakness secondary to the untreated adrenal insufficiency. He was placed back on hydrocortisone and started to improve some.  As far as his malnutrition goes, he was placed on Ensure Plus which he tolerated and liked; therefore, we will continue that as an outpatient.  He does have a history of hypertension. Blood pressure remained stable during his hospital stay, therefore nothing was continued or started at this time.   DISPOSITION:  He is in stable condition and will be discharged to home. I am concerned about his level of compliance and understanding. I asked PT to continue to follow him because of his weakness and also asked nursing to follow him for assessment and also for medication compliance. He will follow up with Dr. Gabriel Carina as directed and follow up with Dr. Netty Starring within 10 days of discharge.     ____________________________ Dion Body, MD kl:ce D: 01/23/2013 12:11:06 ET T: 01/23/2013 12:50:47 ET JOB#: [id number]  cc: Dion Body, MD, <Dictator> Dion Body MD ELECTRONICALLY SIGNED 02/05/2013 8:19

## 2014-11-22 NOTE — Consult Note (Signed)
PATIENT NAME:  Gary Hahn, Gary Hahn MR#:  [id number] DATE OF BIRTH:  03-Feb-1941  DATE OF CONSULTATION:  01/22/2013  REFERRING PHYSICIAN:  Dion Body, MD  CONSULTING PHYSICIAN:  A. Lavone Orn, MD  CHIEF COMPLAINT: Adrenal insufficiency.   HISTORY OF PRESENT ILLNESS: This is a 74 year old male who was admitted on 06/20 with hypoglycemia and complaints of weakness. Initial serum blood sugar was 64 and on finger stick blood sugar he was 17 to 26. The patient was treated with IV dextrose and had improvement in blood sugars. He has known adrenal insufficiency. This was initially diagnosed during a hospitalization in June 2012 in the setting of a hospitalization for hyponatremia and hypothyroidism. Around that time, he had undergone a CT chest and abdomen which showed normal-appearing adrenal glands. He was initiated on thyroid hormone replacement and several weeks later had been brought into the office for a cosyntropin stimulation test, which he failed. His stimulated cortisol increased to only 11.8. He was initiated on hydrocortisone which had been continued, to my knowledge, up until at least September 2013, when he was last seen in the office here. He has subsequently missed follow-up office visits. His last prescribed dose of hydrocortisone was 10 mg per day. On admission for this hospitalization, there are records indicate that he had stopped his hydrocortisone several months before. The patient is a poor historian. He does not know where he is at. He is not recognizing the medications I am mentioning. He does seem to recognize me but does not understand the nature of his medical condition. He denies lightheadedness on standing. He reports a weight loss of "50 pounds." However, in reviewing his records, I think his weight is down about 5 pounds in the last 9 months only. He denies any nausea or vomiting or pain.   PAST MEDICAL HISTORY: 1.  Adrenal insufficiency, diagnosis July 2012.  2.  Chronic  lung nodules, followed at [facility].  3.  Hypertension.  4.  History of hypothyroidism.   PAST SURGICAL HISTORY: 1.  Appendectomy.  2.  Hemorrhoidectomy.   SOCIAL HISTORY: The patient lives with family. No tobacco or alcohol use.  FAMILY HISTORY: No known adrenal disorders.   ALLERGIES: No known drug allergies.   CURRENT INPATIENT MEDICATIONS:  1.  Hydrocortisone 10 mg once daily.  2.  Heparin 5000 units subcutaneous q. 8 hours.  4.  Pantoprazole 40 mg daily.    REVIEW OF SYSTEMS:  HEENT: Denies blurred vision. Denies sore throat.  GENERAL: Denies fevers.  Weight loss as per HPI.  NECK: Denies neck pain, denies dysphagia.  CARDIAC: Denies chest pain. Denies palpitations.  PULMONARY: Denies cough. Denies shortness of breath.  ABDOMEN: Denies abdominal pain. Reports appetite is fair.  EXTREMITIES: Denies leg swelling.  GENITOURINARY: Denies dysuria, hematuria.  ENDOCRINE: Denies heat or cold intolerance.  HEMATOLOGIC: Denies easy bruisability or recent bleeding.  NEUROLOGIC: Denies recent falls. Denies tremor.   LABORATORY DATA: Blood sugars over the last 24 hours have been in the range of 73 to 130 on finger stick testing. Serum labs this morning include glucose 77, BUN 16, creatinine 1.03, sodium 139, potassium 4.4, calcium 8.2. On 06/21, TSH 2.71.  On 06/22 at 5:40 a.m., cortisol 5.4.   PHYSICAL EXAMINATION: VITAL SIGNS: Height 70.9 inches, weight 135 pounds, BMI 18.8. Temperature 98.4, pulse 73, respirations 16, blood pressure 142/77, pulse oximetry 97% on room air.  GENERAL: Thin African American male in no distress.  PSYCHIATRIC: Disoriented to place and time, oriented to self, calm  and cooperative.  HEENT: EOMI. Oropharynx is clear.  NECK: Supple. No thyromegaly noted.   LYMPHADENOPATHY:  No submandibular lymphadenopathy.  CARDIAC: Regular rhythm. No carotid bruit.  PULMONARY: Clear bilaterally. No wheeze.  ABDOMEN: Soft, nontender, nondistended.   EXTREMITIES: No edema is present.  NEUROLOGIC: No tremor is present.   ASSESSMENT: A 74 year old male with a history of adrenal insufficiency, presenting with hypoglycemia, without hypertension on 06/20. He did have mild hypoglycemia with a serum sodium in the 134 to 135 range at that time presumably with uncontrolled adrenal insufficiency, now improved after initiation of hydrocortisone.  Hypoglycemia also has improved, and he has not had any hypoglycemic episodes in the last 24 hours.   RECOMMENDATIONS: 1.  Would increase hydrocortisone to 10 mg twice daily, as this is more physiologic. Once daily dosing was likely given to him to aid with compliance. He does have a problem with noncompliance. I have also been attempting to wean off the hydrocortisone, although it is not clear that is going to be successful. Continue hydrocortisone 10 mg b.i.d., and I can reassess as an outpatient in the next few weeks.  2.  Mental status is a bit altered, and it is not clear why. I would like to talk to the family to get more history.  I placed a call to the home, but there was no answer.  Hopefully, I will be able to speak to his son, who has brought him to clinic visits in the past.   Thank you for the kind request for consultation, and I will continue to follow along with you.   TIME SPENT:   With patient and coordinating care was over 45 minutes.   ____________________________ A. Lavone Orn, MD ams:cb D: 01/22/2013 13:50:54 ET T: 01/22/2013 15:13:02 ET JOB#: [id number]  cc: A. Lavone Orn, MD, <Dictator> Sherlon Handing MD ELECTRONICALLY SIGNED 01/25/2013 8:22

## 2014-11-22 NOTE — Consult Note (Signed)
Chief Complaint:  Subjective/Chief Complaint sedated, sleepy.  staff report no abdominal pain, smear of bm earlier.   VITAL SIGNS/ANCILLARY NOTES: **Vital Signs.:   02-Mar-14 09:36  Vital Signs Type Routine  Temperature Temperature (F) 97.2  Celsius 36.2  Temperature Source axillary  Pulse Pulse 72  Respirations Respirations 20  Systolic BP Systolic BP 081  Diastolic BP (mmHg) Diastolic BP (mmHg) 76  Mean BP 108  Pulse Ox % Pulse Ox % 100  Pulse Ox Activity Level  At rest  Oxygen Delivery Room Air/ 21 %   Brief Assessment:  Cardiac Regular   Respiratory clear BS   Gastrointestinal details normal Soft  Nondistended  No masses palpable  Bowel sounds normal  seems non-tender, to mild diffuse discomfort   Lab Results: Routine Chem:  02-Mar-14 04:21   Glucose, Serum 88  BUN  26  Creatinine (comp)  1.38  Sodium, Serum 145  Potassium, Serum 4.5  Chloride, Serum  113  CO2, Serum 28  Calcium (Total), Serum  8.3  Anion Gap  4  Osmolality (calc) 293  eGFR (African American)  60  eGFR (Non-African American)  51 (eGFR values <39m/min/1.73 m2 may be an indication of chronic kidney disease (CKD). Calculated eGFR is useful in patients with stable renal function. The eGFR calculation will not be reliable in acutely ill patients when serum creatinine is changing rapidly. It is not useful in  patients on dialysis. The eGFR calculation may not be applicable to patients at the low and high extremes of body sizes, pregnant women, and vegetarians.)  Routine Coag:  02-Mar-14 04:21   Activated PTT (APTT)  120.0 (A HCT value >55% may artifactually increase the APTT. In one study, the increase was an average of 19%. Reference: "Effect on Routine and Special Coagulation Testing Values of Citrate Anticoagulant Adjustment in Patients with High HCT Values." American Journal of Clinical Pathology 2006;126:400-405.)    10:51   Activated PTT (APTT)  80.7 (A HCT value >55% may artifactually  increase the APTT. In one study, the increase was an average of 19%. Reference: "Effect on Routine and Special Coagulation Testing Values of Citrate Anticoagulant Adjustment in Patients with High HCT Values." American Journal of Clinical Pathology 2006;126:400-405.)  Routine Hem:  02-Mar-14 04:21   WBC (CBC) 8.5  RBC (CBC)  3.59  Hemoglobin (CBC)  10.9  Hematocrit (CBC)  30.7  Platelet Count (CBC) 166  MCV 86  MCH 30.4  MCHC 35.5  RDW 14.4  Neutrophil % 82.6  Lymphocyte % 6.1  Monocyte % 11.1  Eosinophil % 0.0  Basophil % 0.2  Neutrophil #  7.0  Lymphocyte #  0.5  Monocyte # 0.9  Eosinophil # 0.0  Basophil # 0.0 (Result(s) reported on 01 Oct 2012 at 04:43AM.)   Radiology Results: CT:    25-Feb-14 19:23, CT Abdomen and Pelvis Without Contrast  CT Abdomen and Pelvis Without Contrast   REASON FOR EXAM:    (1) severe diffuse abd pain, new onset AF, lactate   9.6 - suspect mesenteric isch  COMMENTS:       PROCEDURE: CT  - CT ABDOMEN AND PELVIS W0  - Sep 26 2012  7:23PM     RESULT: Indication: Diffuse abdominal pain    Comparison: None    Technique: Multiple axial images from the lung bases to the symphysis   pubis were obtained without oral and without intravenous contrast.    Findings:  The lung bases are clear. There is no pleural or pericardial effusions.  There is a nonobstructing right renal calculus.. No obstructive uropathy.   No perinephric stranding is seen. The kidneys are symmetric in size   without evidence for exophytic mass. The bladder is unremarkable.    The liver demonstrates no focal abnormality. The gallbladder is   unremarkable. The spleen demonstrates no focal abnormality. The adrenal   glands and pancreas are normal.     Evaluation of the small bowel and colon is limited secondary to lack of   enteric contrast. There is fecal impaction in the rectum. There is   diverticulosis without evidence of diverticulitis. There is a dilated   loop of  small bowel in the left abdomen measuring 3.3 cm in diameter with   an air-fluid level. There are other nondilated loops of small bowel with     air-fluid levels. There is no pneumoperitoneum, pneumatosis, or portal   venous gas. There is no abdominal or pelvic free fluid. There is no   lymphadenopathy.     The abdominal aorta is normal in caliber with atherosclerosis.    Lumbar spine spondylosis.    IMPRESSION:     1. There are multiple small bowel loops with air-fluid levels and a   solitary dilated loop of small bowel in the left midabdomen. This may   reflect an ileus versus enteritis. Evaluation of the small bowel and   colon is extremely limitedsecondary to lack of enteric contrast.  Dictation Site: 1        Verified By: Jennette Banker, M.D., MD   Assessment/Plan:  Assessment/Plan:  Assessment 1) c. diff colitis-on abx/flagyl po-diarrhea improving.  2) abdominal pain, improved.  concern of possible mesenteric ischemia.  Patient presented with abdominal pain, metabolic acidosis in the setting of renal failure.   Plan 1) would recommend further evaluation of concern for mesenteric ischemia.  Would need to be able to do a contrasted CT to evaluate the mesenteric branches.  Currently creatinine is elevated though improved from admission.  Will discuss with Dr Caryl Comes in am.  continue current tx for c.diff./   Electronic Signatures: Loistine Simas (MD)  (Signed 02-Mar-14 14:32)  Authored: Chief Complaint, VITAL SIGNS/ANCILLARY NOTES, Brief Assessment, Lab Results, Radiology Results, Assessment/Plan   Last Updated: 02-Mar-14 14:32 by Loistine Simas (MD)

## 2014-11-22 NOTE — Consult Note (Signed)
PATIENT NAME:  Gary Hahn, Gary Hahn MR#:  [id number] DATE OF BIRTH:  14-Feb-1941  DATE OF CONSULTATION:  09/29/2012  REFERRING PHYSICIAN:   CONSULTING PHYSICIAN:  Gonzella Lex, MD  IDENTIFYING INFORMATION AND REASON FOR CONSULTATION:  The patient is a 74 year old man with multiple medical problems including renal failure, malnutrition and gastrointestinal bleeding.  Consultation for agitated behavior.   HISTORY OF PRESENT ILLNESS:  Information was obtained from the patient to some degree, also from the sitter staff and the nurses on the unit and the chart.  The patient is currently being treated for multiple medical problems.  He was brought into the hospital with altered mental status, appears to probably have some ischemia in his abdomen, has gastrointestinal bleeding, renal failure, adrenal insufficiency, past history of a lung mass.  Since being in the hospital, he has had an extensive work-up.  He continues to have problems with his electrolytes.  The patient has been having delirious-type behavior that has been difficult to manage.  He tries to pull his IVs out, tries to get out of bed, kicks frequently.  Has kicked several staff members when they have been trying to help him.  On interview today, the patient was able to say his name and was able to tell me what town he was in, but was not able to clearly describe what kind of a facility he was in.  He was disoriented as to the date completely as well as the season.  He was not able to give me a clear description as to why he was in the hospital.  I tried to do a little bit more mini mental status exam testing with him.  He was not able to spell the word world forwards or backwards.  He was not able to name a couple of objects correctly.  He was able to repeat three objects immediately, but could not remember any of them at about two minutes.  He also was not able to remember the year or where he was even after repeating them several times to me.  He was  able to follow a three-step command, but did it very slowly.  I did not make any attempt to test his writing or drawing skills.  I tried to give a basic description to him of why he was in the hospital.  The patient told me several times that he wanted to go home now.  I tried to get him to understand that he was simply too sick to be discharged at this time and explained what the consequences would likely be of his trying to go home at the moment without further medical care.  He seemed to be following me, but I am not sure that he really processed that information.   PAST PSYCHIATRIC HISTORY:  The patient has been identified as having some degree of dementia in the past.  I am not sure if he has had any other past psychiatric history.  I do not see any prior contact with psychiatric providers in the past.  No evidence of history of psychiatric hospitalization or suicidal behavior.   PAST MEDICAL HISTORY:  As noted above, complicated and continues to be sick.   SOCIAL HISTORY:  The patient tells me that he lives with his brother and sister-in-law.  He tells me that they take care of him at home.  I did not speak to them.  I am not sure how able they would be to take care of him in his  current condition.   CURRENT MEDICATIONS:  Folic acid, Solu-Medrol 60 mg q. 12 hours IV, metoprolol 50 mg a day, metronidazole 500 mg q. 8 hours, Protonix 40 mg IV q. 6:00 a.m., insulin on a sliding scale.   MENTAL STATUS EXAMINATION:  Very sick-looking, very malnourished, dehydrated, patient interviewed in a hospital bed.  He was awake and appeared to be alert.  Made eye contact.  Made an effort to engage in conversation.  Speech was extremely difficult to understand.  Affect was flat for the most part.  He seemed to be a little bit tearful and sad when he asked me if he could go home.  His mood was described as being okay.  Thoughts appeared to be confused, slow, very impaired in his thinking.  Possibly delusional.  It is  hard to pin it down.  He waxed and waned in his orientation and alertness.  As noted above, he was not able to identify the year, the date or the place or give me a description of why he was in the hospital.  He was not able to remember any words or facts for more than a few seconds.   LABORATORY RESULTS:  Multiple labs being done on an ongoing basis.  The patient continues to have a high creatinine, low potassium, some elevated liver function tests, low protein, low albumin.   ASSESSMENT:  This is a 74 year old man with multiple medical problems, presents with significant dementia and probably some degree of delirium.  His agitated behavior does not appear to be the product of a mood disorder or psychotic disorder, but a cognitive problem.  I imagine he cannot even remember where he is or what is going on more than a few seconds at a time making it impossible for him to really consistently follow directions, especially if he is uncomfortable, which I am sure he is.  Under these circumstances for the safety of the patient and the caregivers, it is reasonable to try and use medication to control the delirium.  I note that while I have seen this patient he has also been seen by palliative care who have treated him with psychiatric medicines and that today another psychiatric consult was called, although I have already seen the patient and been treating him.  I do not want to get into a situation of dueling providers with the medication.  My suggestion would be modest round-the-clock doses of Risperdal by mouth or IM if needed, augmented with IV doses of Haldol as needed.  That is the way I have the orders written right now.  Dr. Franchot Mimes has also suggested Ativan.  That could certainly be tried, but I would watch out for the possibility that it might make him more delirious, especially when he starts to withdraw from a given dose.  There is no other specific treatment (Dictation Anomaly) to offer.  I checked an  ammonia level on him just to make sure that there was not that sort of hepatic encephalopathy, but it is negative.  I will try to follow up with him.  The patient will probably continue to have problems with agitation while he continues to be sick.   DIAGNOSIS PRINCIPLE AND PRIMARY:  AXIS I:  Delirium secondary to multiple medical problems.   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  AXIS II:  No diagnosis.  AXIS III:  Multiple medical problems.  AXIS IV:  Severe from burden of illness.  AXIS V:  Functioning  at time of evaluation 25.       ____________________________ Gonzella Lex, MD jtc:ea D: 09/30/2012 23:16:00 ET T: 09/30/2012 23:52:36 ET JOB#: [id number]  cc: Gonzella Lex, MD, <Dictator> Gonzella Lex MD ELECTRONICALLY SIGNED 10/01/2012 14:03

## 2014-11-22 NOTE — H&P (Signed)
PATIENT NAME:  Gary Hahn, Gary Hahn MR#:  [id number] DATE OF BIRTH:  [birth year]  DATE OF ADMISSION:  01/19/2013  PRIMARY CARE PHYSICIAN: Dion Body, MD  REFERRING PHYSICIAN: Arman Filter, MD  CHIEF COMPLAINT: Weakness, unable to walk for a few days.   HISTORY OF PRESENT ILLNESS: The patient is a 74 year old African American male with a history of hypertension and adrenal insufficiency, severe malnutrition, presented to the ED with above chief complaint. The patient is alert, awake, oriented. The patient complains of weakness, unable to walk for a few days. He was sent by his son and was noted to have a low blood sugar of 17. The patient was treated with glucose and blood sugar increased to 146. The patient denies any headache, dizziness, but feels very weak, poor appetite, thirsty, unable to walk. The patient said he is not on any medication.   PAST MEDICAL HISTORY: Hypertension, adrenal insufficiency, severe nutrition.   PAST SURGICAL HISTORY: Appendectomy and hemorrhoidectomy.   SOCIAL HISTORY: Lives with his son and daughter-in-law. No smoking, alcohol drinking or illicit drugs.   FAMILY HISTORY: Hypertension.   ALLERGIES: No.   HOME MEDICATIONS: No.   REVIEW OF SYSTEMS:   CONSTITUTIONAL: The patient denies any fever or chills. No headache or dizziness, but has  generalized weakness and weight loss.  EYES: No double vision or blurred vision.  EARS, NOSE, THROAT: No postnasal drip, slurred speech or dysphagia.  CARDIOVASCULAR: No chest pain, palpitations, orthopnea or nocturnal dyspnea. No leg edema.  PULMONARY: No cough, sputum, shortness of breath or hemoptysis.  GASTROINTESTINAL: No abdominal pain, nausea, vomiting or diarrhea. No melena or bloody stool.  GENITOURINARY: No dysuria, hematuria or incontinence.  SKIN: No rash or jaundice.  NEUROLOGIC: No syncope, loss of consciousness or seizure.  HEMATOLOGIC: No easy bruising or bleeding.  ENDOCRINE: No polyuria, polydipsia,  heat or cold intolerance.   PHYSICAL EXAMINATION: VITAL SIGNS: Temperature 98.6, blood pressure 144/80, pulse 81, O2 saturation 97% on room air.  GENERAL: The patient is alert, awake, oriented, in no acute distress. The patient is very thin. HEENT: Pupils round, equal and reactive to light and accommodation. Dry oral mucosa. Clear oropharynx.  NECK: Supple. No JVD or carotid bruits. No lymphadenopathy. No thyromegaly.  CARDIOVASCULAR: S1, S2, regular rate and rhythm. No murmurs or gallops.  PULMONARY: Bilateral air entry. No wheezing or rales. No use of accessory muscle to breathe.  ABDOMEN: Soft. No distention or tenderness. No organomegaly. Bowel sounds present.  EXTREMITIES: No edema, clubbing or cyanosis. No calf tenderness.  NEUROLOGIC: The patient is alert, weak. Has mild confusion. Follows commands. No focal deficits. Power 5/5. Sensation intact.   LABORATORY AND RADIOLOGICAL DATA: Glucose 26 and 17, and increased to 140. Chest x-ray: No acute cardiopulmonary disease. WBC 7.7, hemoglobin 11.2, hematocrit 31, platelets 297. BMP shows glucose 64, BUN 14, creatinine 1.26, sodium 134, potassium 4.1, chloride 101, bicarbonate 23. Troponin less than 0.02, CK 203. Urinalysis is negative. Lipase 38.   IMPRESSION: 1.  Severe hypoglycemia.  2.  Hyponatremia.  3.  Dehydration.  4.  Anemia.  5.  Elevated bilirubin. The patient's bilirubin is 2.1.  6.  Severe malnutrition.  7.  Hypertension.  8.  Adrenal insufficiency.   PLAN OF TREATMENT: The patient will be admitted to medical floor. We will start D5 normal saline IV. Accu-Chek q.2 hours. If stable, may change to q.4 hours. Will get aspiration and fall precautions and a PT consult. In addition, we will get a dietitian consult. For elevated bilirubin, we  will get an abdominal ultrasound. GI and DVT prophylaxis. Discussed the patient's condition and plan of treatment with the patient.   TIME SPENT: About 55 minutes.     ____________________________ Demetrios Loll, MD qc:jm D: 01/19/2013 22:12:59 ET T: 01/19/2013 22:40:48 ET JOB#: [id number]  cc: Demetrios Loll, MD, <Dictator> Demetrios Loll MD ELECTRONICALLY SIGNED 01/20/2013 14:52

## 2014-11-22 NOTE — Consult Note (Signed)
PATIENT NAME:  Gary Hahn, Gary Hahn MR#:  [id number] DATE OF BIRTH:  [birth year]  DATE OF CONSULTATION:  09/27/2012  REFERRING PHYSICIAN:   CONSULTING PHYSICIAN:  Isaias Cowman, MD  PRIMARY CARE PHYSICIAN:  [place].  CHIEF COMPLAINT: Abdominal discomfort.   REASON FOR CONSULTATION: Consultation requested for evaluation for atrial fibrillation.   HISTORY OF PRESENT ILLNESS: The patient is a 74 year old gentleman with history of hypertension and renal insufficiency who presents with chief complaint of confusion and abdominal discomfort. The patient has a several-day history of gradual generalized weakness and abdominal pain. In the Emergency Room, the patient was noted to be in renal failure, severe acidosis and vague abdominal pain suggestive of mesenteric ischemia. The patient was also in atrial fibrillation with controlled rate. The patient was seen by Dr. Bronson Ing, who felt that the patient very well may be experiencing intestinal ischemia. The patient was started on a heparin drip with clinical improvement today. The patient is in atrial fibrillation though patient is asymptomatic and rate is controlled on no medications.   PAST MEDICAL HISTORY: 1.  Hypertension.  2.  Adrenal insufficiency.   MEDICATIONS:  Lisinopril/HCTZ 20/12.5 mg daily. Hydrocortisone 10 mg daily.   SOCIAL HISTORY: The patient lives with his son and daughter-in-law, denies tobacco abuse.   FAMILY HISTORY: No immediate family history for coronary artery disease or myocardial infarction.   REVIEW OF SYSTEMS:  CONSTITUTIONAL: No fever or chills.  EYES: No blurry vision.  EARS: No hearing loss.  RESPIRATORY: No shortness of breath.  CARDIOVASCULAR: No chest pain.  GASTROINTESTINAL:  The patient had mid abdominal discomfort. Denies nausea, vomiting, constipation.  GENITOURINARY:  No dysuria or hematuria.  ENDOCRINE: No polyuria or polydipsia.  MUSCULOSKELETAL: No arthralgias or myalgias.  NEUROLOGICAL: No  focal muscle weakness or numbness.  PSYCHOLOGICAL: No depression or anxiety.   PHYSICAL EXAMINATION: VITAL SIGNS: Blood pressure 133/64, pulse 88 and irregular, respirations 22, temperature 98.1,  pulse ox was 73%.  HEENT: Pupils equal and reactive to light and accommodation.  NECK: Supple without thyromegaly.  LUNGS: Clear.  HEART: Normal JVP. Normal PMI. Irregularly irregular rhythm. Normal S1, S2. No appreciable gallop, murmur or rub.  ABDOMEN: Soft and nontender. Pulses were intact bilaterally.  MUSCULOSKELETAL: Normal muscle tone.  NEUROLOGIC: The patient is alert and oriented x 3. Motor and sensory both grossly intact.   IMPRESSION: This is a 74 year old gentleman who presents with generalized weakness, mid abdominal discomfort consistent with mesenteric or intestinal ischemia which appears to have improved, currently on heparin drip. The patient has atrial fibrillation of unknown duration, rate well controlled with a CHADS2 score of 1.   RECOMMENDATIONS: 1.  Agree with all of the current therapy.  2.  The patient is currently on heparin drip.  3.  At this point, the patient is a borderline candidate for chronic anticoagulation based on atrial fibrillation alone with a CHADS2 score of 1.  4.  Consider 2-D echocardiogram.  5.  Would defer further cardiac diagnostics at this time.      ____________________________ Isaias Cowman, MD ap:cs D: 09/27/2012 14:04:00 ET T: 09/27/2012 14:21:34 ET JOB#: [id number]  cc: Isaias Cowman, MD, <Dictator> Isaias Cowman MD ELECTRONICALLY SIGNED 11/01/2012 12:24

## 2014-11-22 NOTE — Consult Note (Signed)
PATIENT NAME:  Gary Hahn, CARFAGNO MR#:  [id number] DATE OF BIRTH:  Feb 23, 1941  DATE OF CONSULTATION:  09/26/2012  REFERRING PHYSICIAN:   CONSULTING PHYSICIAN:  Rodena Goldmann III, MD  PRIMARY CARE PHYSICIAN:  A. Lavone Orn, MD, [facility]  CHIEF COMPLAINT:  Weakness and abdominal pain.   BRIEF HISTORY:  The patient is a 74 year old gentleman seen in the Emergency Room with a several day history of weakness and progressive abdominal pain. His brother is present. The patient is significantly sedated at the time of my evaluation and does contribute to the history, but primary history is taken from the brother. He has had progressive onset of abdominal discomfort and weakness over the last several days dating back to February 21st. He lives with his brother, but they do not interact on a daily basis and the patient lives in the basement of their home. According to the brother, the patient has not been eating normally with much worse appetite and has not been active or involved in the normal family life. He has denied any significant problems up to the development of pain on Friday. He had progressive weakness over the weekend and presented to [facility] acute care this afternoon. They referred him to the Emergency Room for further evaluation.   The patient denies any nausea or vomiting, but he has been anorexic. Denies any weight loss. He did not admit to any normal bowel function, but the family has not noted any diarrhea or rectal bleeding. The patient presented with weakness and workup demonstrated a blood pressure of approximately 60 systolic and the patient was in atrial fibrillation. He began to complain of significant abdominal discomfort while he was in the Emergency Room which is progression of his symptoms since the 21st. The Emergency Room evaluation suggested that he had significant pain out of proportion to his physical findings and the diagnosis of possible mesenteric ischemia was  entertained. Laboratory workup demonstrated known chronic renal failure with a creatinine of 2.4. His carbon dioxide was 16. Troponins were normal. Lactic acid was 9.6 with a venous pO2 of 7.17. The surgical service was consulted.   His family denies any other previous serious GI problems. He has had a previous hemorrhoidectomy. He has not had a recent colonoscopy. Denies history of hepatitis, yellow jaundice, pancreatitis, peptic ulcer disease, gallbladder disease or diverticulitis. Denies any cardiac disease. He does have a history of hypertension. He was admitted in June of this past year for weakness and mild renal insufficiency. His blood pressure was better controlled and he was evaluated by the renal service. He was not felt to have reversible kidney disease at that time. His creatinine in June was 1.24. On admission, it has been as high as 1.54.   CURRENT MEDICATIONS:  Hydrocortisone 10 mg p.o. once a day, hydrochlorothiazide/lisinopril 1 tablet once a day.   ALLERGIES:  He has no known allergies.   FAMILY HISTORY:  Not possible.   REVIEW OF SYSTEMS:  Not possible at this point.   PHYSICAL EXAMINATION: GENERAL: He is quite lethargic, but arousable. He has taken a significant amount of pain medicine and is no longer in any pain.  VITAL SIGNS: His blood pressure is 86/50, heart rate is 94 and regular and appears to be in normal sinus rhythm.  HEENT: Exam reveals significant crusting about his eyes. He has no pupillary abnormalities. He is anicteric. No facial deformities.  NECK: Supple with no tenderness and no adenopathy. Midline trachea.  LUNGS: Clear with no adventitious  sounds.  CARDIAC: No murmurs or gallops to my ear. He seems to be in normal sinus rhythm. His distal pulse is confirmed. It feels like normal sinus rhythm and on the monitor he appears to be back in sinus rhythm.  ABDOMEN: Soft with no significant abdominal tenderness. He does not have any point tenderness. There is no  rebound, no guarding, no hernias and no masses. He has good femoral pulses bilaterally and trace distal pulses with no edema. No lower extremity deformities and full range of motion.  PSYCHIATRIC: Not possible in the current situation.   DIAGNOSTIC DATA:  Currently reveals creatinine 2.39, sodium 133, potassium 3.9, glucose of 80, BUN of 43, chloride of 97, CO2 is 16, bilirubin 2.4. His troponin is normal. CPK is elevated at 1000. Lactate is 9.6 with a venous pO2 of 17.17. No imaging has been performed today.   IMPRESSION:  This gentleman's clinical picture and laboratory work supports the diagnosis of intestinal ischemia. With the gradual, but increasing onset, I would suggest this is an acute on chronic situation rather than an embolic problem. He possibly could even have a low flow state ischemic problem or a venous mesenteric insufficiency. At the present time, I do not see any surgical indications. I would suggest rehydration, anticoagulation, correction of his laboratory values and further followup. At the present time, I would avoid a contrasted imaging study to reduce or eliminate the risk of further increasing his renal insufficiency. At the present time, I do not think vascular intervention will be of any benefit. I have talked with the vascular surgeon on call and outlined my plan to him in detail. He is in agreement at the present time with this recommendation. We will plan to follow him while he is hospitalized and would recommend admission to the medical service.    ____________________________ Rodena Goldmann III, MD rle:si D: 09/26/2012 20:04:03 ET T: 09/26/2012 20:20:06 ET JOB#: [id number]  cc: Rodena Goldmann III, MD, <Dictator> A. Lavone Orn, MD Rodena Goldmann MD ELECTRONICALLY SIGNED 09/26/2012 23:19

## 2014-11-22 NOTE — Consult Note (Signed)
Details:   - Psychiatry: Patient seen. Spoke with nursing and the sitter. Patient still agitated and difficult to manage. Confused. Demented. Not able to remember long enough to stay calm. I will increase the risperdal to 0.5mg  q8 hrs. I dced the olanzapine not because it is wrong, but risperdal is usually a little better a choice in terms of minimizing side effects. Will follow.   Electronic Signatures: Gonzella Lex (MD)  (Signed 01-Mar-14 14:17)  Authored: Details   Last Updated: 01-Mar-14 14:17 by Gonzella Lex (MD)

## 2014-11-22 NOTE — Consult Note (Signed)
Chief Complaint:  Subjective/Chief Complaint seen for abdominal pain, possible mesenteric ischemia.  patient more alert this evening, denies abd pain. tolerating po, large loose bm today. tolerting po.   VITAL SIGNS/ANCILLARY NOTES: **Vital Signs.:   03-Mar-14 11:33  Vital Signs Type Routine  Temperature Temperature (F) 98.5  Celsius 36.9  Pulse Pulse 62  Respirations Respirations 19  Systolic BP Systolic BP 025  Diastolic BP (mmHg) Diastolic BP (mmHg) 67  Mean BP 89  Pulse Ox % Pulse Ox % 100  Pulse Ox Activity Level  At rest  Oxygen Delivery Room Air/ 21 %  *Intake and Output.:   03-Mar-14 17:50  Stool  large loose yellow brown    18:46  Stool  large loose yellow / brown   Brief Assessment:  Cardiac Irregular   Respiratory clear BS   Gastrointestinal details normal Soft  Nontender  Nondistended  No masses palpable  Bowel sounds normal   Lab Results: Routine Chem:  03-Mar-14 04:46   Glucose, Serum  105  BUN  21  Creatinine (comp) 1.17  Sodium, Serum 143  Potassium, Serum  3.3  Chloride, Serum  111  CO2, Serum 26  Calcium (Total), Serum  7.8  Anion Gap  6  Osmolality (calc) 288  eGFR (African American) >60  eGFR (Non-African American) >60 (eGFR values <49m/min/1.73 m2 may be an indication of chronic kidney disease (CKD). Calculated eGFR is useful in patients with stable renal function. The eGFR calculation will not be reliable in acutely ill patients when serum creatinine is changing rapidly. It is not useful in  patients on dialysis. The eGFR calculation may not be applicable to patients at the low and high extremes of body sizes, pregnant women, and vegetarians.)  Routine Coag:  03-Mar-14 04:46   Activated PTT (APTT)  147.5 (A HCT value >55% may artifactually increase the APTT. In one study, the increase was an average of 19%. Reference: "Effect on Routine and Special Coagulation Testing Values of Citrate Anticoagulant Adjustment in Patients with High HCT  Values." American Journal of Clinical Pathology 2006;126:400-405.)    12:31   Activated PTT (APTT)  62.6 (A HCT value >55% may artifactually increase the APTT. In one study, the increase was an average of 19%. Reference: "Effect on Routine and Special Coagulation Testing Values of Citrate Anticoagulant Adjustment in Patients with High HCT Values." American Journal of Clinical Pathology 2006;126:400-405.)    19:34   Activated PTT (APTT)  96.9 (A HCT value >55% may artifactually increase the APTT. In one study, the increase was an average of 19%. Reference: "Effect on Routine and Special Coagulation Testing Values of Citrate Anticoagulant Adjustment in Patients with High HCT Values." American Journal of Clinical Pathology 2006;126:400-405.)  Routine Hem:  03-Mar-14 04:46   WBC (CBC) 7.7  RBC (CBC)  3.63  Hemoglobin (CBC)  10.9  Hematocrit (CBC)  30.8  Platelet Count (CBC) 181  MCV 85  MCH 30.0  MCHC 35.4  RDW 14.1  Neutrophil % 77.7  Lymphocyte % 8.1  Monocyte % 14.1  Eosinophil % 0.0  Basophil % 0.1  Neutrophil # 6.0  Lymphocyte #  0.6  Monocyte #  1.1  Eosinophil # 0.0  Basophil # 0.0 (Result(s) reported on 02 Oct 2012 at 05:20AM.)   Assessment/Plan:  Assessment/Plan:  Assessment 1) abdominal pain-resolved.  unusual presentation with concern for mesenteric ischemia 2) positive c. diff-on iv metronidazole, improving.  3) heme positive stool-comorbidity and current health status make him a poor risk for luminal evaluation.  heme positive stool possibly related to c. diff.   Plan 1) for further evaluation of possible mesenteric ischemia, would need to do CTA, with the contrast load a possible  risk for renal status.  Would continue daily ppi,get h. pylori serologies.   Electronic Signatures: Loistine Simas (MD)  (Signed 03-Mar-14 21:29)  Authored: Chief Complaint, VITAL SIGNS/ANCILLARY NOTES, Brief Assessment, Lab Results, Assessment/Plan   Last Updated: 03-Mar-14  21:29 by Loistine Simas (MD)

## 2014-11-22 NOTE — Consult Note (Signed)
Brief Consult Note: Diagnosis: AF, asymptomatic, CHADS2 1.   Patient was seen by consultant.   Consult note dictated.   Comments: REC  Agree with current therapy, defer chronic anticoagulation unless for intestinal ischemia, consider echo, defer further cardiac diagnostics at this time.  Electronic Signatures: Isaias Cowman (MD)  (Signed 26-Feb-14 14:05)  Authored: Brief Consult Note   Last Updated: 26-Feb-14 14:05 by Isaias Cowman (MD)

## 2014-11-22 NOTE — Consult Note (Signed)
Brief Consult Note: Diagnosis: delirium due to multiple medical issues and dementia.   Patient was seen by consultant.   Consult note dictated.   Recommend further assessment or treatment.   Orders entered.   Comments: Psychiatry: Patient seen.Chart reviewed. Patient is demented and intermittantly delirious. Memory is too poor for him to remember he needs to stay still and in bed. I ordered an ammonia level for tomorrow to look for one other cause although he has plenty of reason to be delirious. Order done for risperdal 0.25mg  q8hrs standing for agitation in addition to the current haldol order.  Electronic Signatures: Gonzella Lex (MD)  (Signed 28-Feb-14 20:35)  Authored: Brief Consult Note   Last Updated: 28-Feb-14 20:35 by Gonzella Lex (MD)

## 2014-11-22 NOTE — Consult Note (Signed)
   Comments   I met with pt's son, Wheeler. Updated him on pt's current medical condition. Son is hopeful that pt will continue to improve. I talked with son about code status and son says that he thinks pt would want to be a DNR. Son says that if he had to make that decision for pt, he would make him a DNR. I then spoke with pt in the presence of his son and son-in-law. I explained DNR vs full code to pt and he says that he does not want to be placed on "life support". Son and son-in-law both present for discussion and agree with DNR. Order entered.   Electronic Signatures: Tiawana Forgy, Izora Gala (MD)  (Signed 26-Feb-14 11:21)  Authored: Palliative Care   Last Updated: 26-Feb-14 11:21 by Deroy Noah, Izora Gala (MD)

## 2014-11-22 NOTE — Consult Note (Signed)
Chief Complaint:  Subjective/Chief Complaint Patient seen for abdominal pain and possible mesenteric ischemia.  denies abd pain currently, however had problems last night with AMS necessitating sedation. Bowel movement loose today.  no evidence of gi bleeding;Marland Kitchen   VITAL SIGNS/ANCILLARY NOTES: **Vital Signs.:   01-Mar-14 08:14  Vital Signs Type Routine  Temperature Temperature (F) 98.1  Celsius 36.7  Temperature Source tympanic  Pulse Pulse 83  Respirations Respirations 18  Systolic BP Systolic BP 469  Diastolic BP (mmHg) Diastolic BP (mmHg) 75  Mean BP 100  Pulse Ox % Pulse Ox % 96  Pulse Ox Activity Level  At rest  Oxygen Delivery Room Air/ 21 %   Brief Assessment:  Cardiac Irregular   Respiratory clear BS   Gastrointestinal details normal Soft  Nondistended  No masses palpable  Bowel sounds normal  apparently nontender   Lab Results: Hepatic:  01-Mar-14 04:36   Bilirubin, Total 0.7  Alkaline Phosphatase 64  SGPT (ALT) 38  SGOT (AST)  40  Total Protein, Serum  5.8  Albumin, Serum  2.9  Routine Chem:  01-Mar-14 04:36   Ammonia, Plasma < 25 (Result(s) reported on 30 Sep 2012 at 05:35AM.)  Glucose, Serum  115  BUN  27  Creatinine (comp)  1.36  Sodium, Serum 140  Potassium, Serum  3.3  Chloride, Serum  109  CO2, Serum 22  Calcium (Total), Serum  8.0  Osmolality (calc) 285  Anion Gap 9 (Result(s) reported on 30 Sep 2012 at 05:16AM.)  Routine Coag:  01-Mar-14 04:36   Activated PTT (APTT)  95.5 (A HCT value >55% may artifactually increase the APTT. In one study, the increase was an average of 19%. Reference: "Effect on Routine and Special Coagulation Testing Values of Citrate Anticoagulant Adjustment in Patients with High HCT Values." American Journal of Clinical Pathology 2006;126:400-405.)  Routine Hem:  01-Mar-14 04:36   WBC (CBC) 8.7  RBC (CBC)  3.37  Hemoglobin (CBC)  10.4  Hematocrit (CBC)  28.5  Platelet Count (CBC) 160  MCV 85  MCH 30.8  MCHC  36.3   RDW 14.1  Neutrophil % 94.4  Lymphocyte % 1.3  Monocyte % 4.3  Eosinophil % 0.0  Basophil % 0.0  Neutrophil #  8.2  Lymphocyte #  0.1  Monocyte # 0.4  Eosinophil # 0.0  Basophil # 0.0 (Result(s) reported on 30 Sep 2012 at [facility].)   Radiology Results: CT:    25-Feb-14 19:23, CT Abdomen and Pelvis Without Contrast  CT Abdomen and Pelvis Without Contrast   REASON FOR EXAM:    (1) severe diffuse abd pain, new onset AF, lactate   9.6 - suspect mesenteric isch  COMMENTS:       PROCEDURE: CT  - CT ABDOMEN AND PELVIS W0  - Sep 26 2012  7:23PM     RESULT: Indication: Diffuse abdominal pain    Comparison: None    Technique: Multiple axial images from the lung bases to the symphysis   pubis were obtained without oral and without intravenous contrast.    Findings:  The lung bases are clear. There is no pleural or pericardial effusions.    There is a nonobstructing right renal calculus.. No obstructive uropathy.   No perinephric stranding is seen. The kidneys are symmetric in size   without evidence for exophytic mass. The bladder is unremarkable.    The liver demonstrates no focal abnormality. The gallbladder is   unremarkable. The spleen demonstrates no focal abnormality. The adrenal   glands and  pancreas are normal.     Evaluation of the small bowel and colon is limited secondary to lack of   enteric contrast. There is fecal impaction in the rectum. There is   diverticulosis without evidence of diverticulitis. There is a dilated   loop of small bowel in the left abdomen measuring 3.3 cm in diameter with   an air-fluid level. There are other nondilated loops of small bowel with     air-fluid levels. There is no pneumoperitoneum, pneumatosis, or portal   venous gas. There is no abdominal or pelvic free fluid. There is no   lymphadenopathy.     The abdominal aorta is normal in caliber with atherosclerosis.    Lumbar spine spondylosis.    IMPRESSION:     1. There are  multiple small bowel loops with air-fluid levels and a   solitary dilated loop of small bowel in the left midabdomen. This may   reflect an ileus versus enteritis. Evaluation of the small bowel and   colon is extremely limitedsecondary to lack of enteric contrast.  Dictation Site: 1        Verified By: Jennette Banker, M.D., MD   Assessment/Plan:  Assessment/Plan:  Assessment 1) concern for possible mesenteric insufficiency.  labs are improving including creatinine.  Patient admitted with abd pain and acidosis.  Initial ct without contrast. 2) positive c diff-on po flagyl.   Plan 1) recommend continueing to hydrate.  wiht improvement of creatinine, will likely be able to do CTA to evaluate aortic mesenteric takeoffs.  Continue current for now.  CT showed possible ileus pattern, which is non-specific, possibly reflective of the acidosis/possible relation with developing c.diff.  Will recheck 3 way abdominal films today.   Electronic Signatures: Loistine Simas (MD)  (Signed 01-Mar-14 15:19)  Authored: Chief Complaint, VITAL SIGNS/ANCILLARY NOTES, Brief Assessment, Lab Results, Radiology Results, Assessment/Plan   Last Updated: 01-Mar-14 15:19 by Loistine Simas (MD)

## 2014-11-22 NOTE — H&P (Signed)
PATIENT NAME:  Gary Hahn, Gary Hahn MR#:  [id number] DATE OF BIRTH:  August 31, 1940  DATE OF ADMISSION:  09/26/2012  PRIMARY CARE PHYSICIAN:  Gary Body, MD at [facility] [place].   CHIEF COMPLAINT: Confusion and mild abdominal pain.   HISTORY OF PRESENT ILLNESS: The patient is a 74 year old male with past medical history of hypertension and adrenal insufficiency following with his primary care and endocrinology clinic on hydrocortisone for supplementation. He was in his usual state of health until 3 to 4 days ago, as per the brother who is present in the room and gave me all the history. The patient is currently totally confused and unable to give any history. As per the brother, in his usual status, the patient is able to walk around. He is able to eat and he is fully alert and oriented. He knows where he is and what he is talking about. For the last 3 to 4 days, he started gradually becoming weak and he was not eating well and complaint of mild abdominal pain, but as per the brother, he never had any diarrhea, vomiting or he never noticed any blood in the stool and so finally they brought him over here to the Emergency Room. In the ER, he was found having atrial fibrillation and he was found having acute renal failure, severe acidosis and elevated lactic acid and due to abdominal pain vague complaints, CT of the abdomen was done without contrast due to renal failure. It showed some dilated small bowel loops and some air fluid levels.  The ER physician had a high suspicion for mesenteric ischemia and they called surgery consult. Surgeon, Dr. Pat Patrick, saw the patient in the ER and he suggested doing medical management. He agreed with possible diagnosis of mesenteric ischemia, but due to low volume status  and multiple medical problems currently. He and vascular surgeon and agrees with the medical management. We are admitting the patient to medical service.   REVIEW OF SYSTEMS: Unable to get, as the  patient is not able to say anything.   PAST MEDICAL HISTORY: Hypertension and adrenal insufficiency.   PAST SURGICAL HISTORY: Appendectomy.  HOME MEDICATIONS:  Lisinopril and hydrochlorothiazide 20 mg oral 12.5 mg tablets once a day and hydrocortisone 10 mg oral daily.   SOCIAL HISTORY: He lives with his son and daughter-in-law. No tobacco abuse. He chews tobacco, but nonsmoker and no alcohol use.   FAMILY HISTORY: No family history of thyroid disease or any or any endocrine diseases. Positive for hypertension.   PHYSICAL EXAMINATION: VITAL SIGNS: Temperature 96, pulse rate 87, respirations 20, blood pressure on arrival to ER was 54/42, which came up to 126/58 and pulse oximetry was 98% on room air.   GENERAL: The patient severely malnourished and cachectic. He is totally confused. He is alert and moving his limbs very slowly, looks generalized weakness.  HEENT: Atraumatic but very disheveled and unhygienic. His eyes have a lot of secretions sticking to the corner of the eye. Mouth is dry and skin is wrinkly and very dry.   NECK: Supple.  RESPIRATORY: Bilateral clear and equal air entry.  CARDIOVASCULAR: S1, S2 present irregular. No murmur appreciated.  ABDOMEN:  Guarding present. No mass felt. Bowel sounds sluggish.  SKIN: No rashes.  LEGS: No edema.  NEUROLOGICAL: He moves all 4 limbs but very slowly,  it looks like generalized weakness. No tremors.  PSYCHIATRIC: Unable to assess at this time as the patient is confused.  LABORATORY, DIAGNOSTIC AND RADIOLOGIC DATA: Glucose 80, BUN  43, creatinine 2.39, sodium 133, potassium 3.9, chloride 97, CO2 16, anion gap 20. Lipase 177, magnesium 2.1, calcium 9.7, total protein 7.8, albumin 4.0, bilirubin 2.4, alkaline phosphate 98, SGOT 68, SGPT 20, CK total 1000, CK-MB 14.3 and troponin less than 0.02. WBC 10.1, hemoglobin 14.1, platelet count 285 and MCV 88. pH 7.17 and pCO2 42, this is venous, and lactic acid 9.6.   CT scan of the abdomen and  pelvis without contrast: Multiple small bowel loops with air fluid level and solidly dilated loops of small in the left mid abdomen. This may reflect an ileus versus enteritis. Evaluation of small bowel and colon is extremely limited   ASSESSMENT AND PLAN: New onset atrial fibrillation, severe lactic acidosis, dilated bowel lobes, possible mesenteric ischemia, acute renal failure, confusion, metabolic encephalopathy, severe dehydration.  1.  Possible mesenteric ischemia. Surgery consult noted, suggested medical management, IV heparin, IV fluids, nothing orally and IV PPI. 2.  Acute renal failure. We will give IV fluids and recheck.  3.  New onset atrial fibrillation. We will check TSH, he is on heparin drip, cardiology consult, rate is now 80s, does not need any extra medication at this time.  4.  Severe malnutrition.  5.  Adrenal insufficiency. We will give IV steroid.  6.  Severe dehydration. We will give him IV fluid bolus and then continue normal saline, 200 mL per hour.  PROGNOSIS:  Extremely poor. High-risk for worsening and cardiorespiratory arrest. Discussed with his brother, Mr. Gary Hahn, his cell phone number is [phone]. He was present in the room and he will discuss with the patient's son also.   CODE STATUS:  Currently the patient is full code.   TOTAL TIME SPENT ON CRITICAL CARE: One hour.   Case endorsed to Dr. Emily Hahn, who is on call for [facility].      ____________________________ Gary Lund. Anselm Jungling, MD vgv:cc D: 09/26/2012 21:09:32 ET T: 09/26/2012 21:41:00 ET JOB#: [id number]  cc: Gary Lund. Anselm Jungling, MD, <Dictator> Gary Body, MD Gary Basta MD ELECTRONICALLY SIGNED 10/01/2012 23:15

## 2014-11-22 NOTE — Consult Note (Signed)
   Comments   Pt is more alert, less confused and less agitated than on my previous visit. He can tell me where he is, his DOB & that his birthday is next [birth year]. He ate both breakfast & lunch according to sitter. I spoke with pt's brother Delorise Shiner by phone. Brother has no way to contact pt's son but will go by his house and see if he can get him to come to hospital tomorrow for a meeting. I will meet with brother and hopefully with son ~ noon tomorrow to discuss plan for discharge.    Electronic Signatures: Phifer, Izora Gala (MD)  (Signed 03-Mar-14 22:47)  Authored: Palliative Care   Last Updated: 03-Mar-14 22:47 by Phifer, Izora Gala (MD)

## 2014-11-22 NOTE — Discharge Summary (Signed)
PATIENT NAME:  Gary Hahn, Gary Hahn MR#:  [id number] DATE OF BIRTH:  08/03/1940  DATE OF ADMISSION:  09/26/2012 DATE OF DISCHARGE: 10/05/2012    FINAL DIAGNOSES:  1. Clostridium difficile colitis.  2. Acute renal failure.  3. Dehydration.  4. Metabolic encephalopathy secondary to the above.  5. Rhabdomyolysis.  6. Metabolic acidosis.  7. Adrenal insufficiency.  8. Severe protein calorie malnutrition.  9. Transient atrial fibrillation.  10. Heme-positive stool with possible mesenteric ischemia.   HISTORY AND PHYSICAL: Please see dictated admission history and physical.   [place]: The patient was admitted with severe multisystem organ dysfunction, including acute renal failure, metabolic encephalopathy with metabolic acidosis, as well as evidence of severe dehydration. He had transient atrial fibrillation. He had reported history, or history was obtained from the family, of recent weight loss, abdominal discomfort, possibly related to eating, followed by abrupt-onset worsening abdominal pain just prior to his admission. There was significant concern that he had had recent mesenteric ischemia, in fact, might have had mesenteric infarction. Surgical service saw the patient, and they did not find any surgical treatment was required, so he was admitted to medicine service, where supportive care was administered, and he was aggressively rehydrated. His renal function slowly improved, with the assistance of the nephrology service. Surgery continued to follow, and again, did not appear to require any surgical intervention. He was initially placed on heparin out of the concern for his transient atrial fibrillation and possible mesenteric ischemia/infarction.   As the patient became more awake, he was able to resume diet, did not have any abdominal pain with this. No evidence of mesenteric infarction ever materialized. He had had heme-positive stool; however, he also developed copious  diarrhea, which was sent for C. difficile, and was found to be positive for C. difficile. GI was involved as well, and the case was discussed briefly with vascular surgery. Palliative care also became involved, as did infectious disease.   The overall story appears to be consistent with a gentleman with some degree of chronic malnutrition, poor oral intake, possibly with some underlying mesenteric ischemia, but likely became dehydrated and developed renal failure, acidosis, encephalopathy secondary to  C. difficile colitis. His renal function normalized with hydration, and his diet was slowly advanced.   There was significant difficulty due to his confusion and periodic agitation. He was seen by psychiatry, and medications were adjusted, with intermittent episodes of significant somnolence, although this seemed to improve as sedation was reduced. On the day of discharge, the patient is awake, alert, knows he is to go to a rehabilitation center, but he was not oriented to year. He is able to eat and has been moved to solid foods without difficulty.   After discussion with multiple different specialties, at this time, the thought will be to place him on aspirin 81 mg daily. His echo showed preserved LV function, and his CHADS score was listed as 1, and given the fact that atrial fibrillation was only transient, and not necessarily thought to be the source of the above, it was felt this would represent adequate anticoagulation, particularly in the face of heme-positive stool and underlying colitis. The thought of performing CT angiogram was entertained, but with his recent renal failure, the thought was that we should try to avoid dye load to the patient, and so will anticipate him following up with vascular surgery as an outpatient to consider ultrasound evaluation of the mesenteric arteries and to see whether any evidence of atherosclerosis is evident  that would require further evaluation.   This was  attempted to be communicated with his family members, although it was at times challenging to discuss with them. As noted above, palliative care saw the patient and discussed with family as well, and so he was made do not resuscitate in regard to this. Given his overall poor health prior to hospitalization, as well as significant disruption in his health, at this point, if he does have further deterioration, consideration may need to be for hospice care. At this point, further pursuit of his heme-positive stool would be deferred pending resolution of C. difficile colitis, evaluation of his vasculature, and decision on his overall prognosis as to whether he would be a candidate for endoscopy, as at this point, it does not appear that he would be.   It was clear that he would not be able to return to his prior level of living and required skilled nursing. A bed became available, and at this time, he will be discharged to the nursing facility in stable condition. His physical activity should be up with assistance, and he will have occupational therapy and physical therapy work with him. Speech therapy saw the patient here, and really did not require any major changes in his diet. At this point, we will keep him on a regular diet in order to get maximum calories into the patient, although in the future, he may need salt restriction if his blood pressure rises. In addition, he was given Ensure Plus 3 cans a day and will continue this for now, with further adjustment left to the nursing home physician. Recommended that MET-B, CBC be performed in approximately 1 week, with results to the nursing home physician; however, again, this is deferred to his care. Appointment was made with vascular surgery in 1 to 2 weeks. In addition, followup appointment will be made in the next 2 weeks with Dr. Gabriel Carina with endocrinology to follow up his adrenal insufficiency. He was placed on higher doses of steroids during his  hospitalization, and this was slowly tapered down.   DISCHARGE MEDICATIONS:  1. Hydrocortisone 10 mg p.o. daily.  2. Flagyl 500 mg p.o. q.8 hours for 7 days to complete 14-day course.  3. Aspirin 81 mg p.o. daily.  4. Risperdal 0.5 mg p.o. b.i.d., to be held for sedation.  5. Toprol-XL 50 mg p.o. daily. 6. Amlodipine 5 mg p.o. daily.  7. Pantoprazole 40 mg p.o. daily.   The patient previously had been on lisinopril/HCTZ, but this was held due to his renal failure, and at this point, we will continue to hold it.   CODE STATUS: The patient was do not resuscitate in our facility, and DNR form was sent with him to the nursing facility.   TOTAL TIME OF DISCHARGE: 45 minutes.    ____________________________ Adin Hector, MD bjk:OSi D: 10/05/2012 12:51:07 ET T: 10/05/2012 13:14:37 ET JOB#: [id number]  cc: Adin Hector, MD, <Dictator> Katha Cabal, MD A. Lavone Orn, MD Dion Body, MD Discharge [place] MD ELECTRONICALLY SIGNED 10/09/2012 12:29

## 2014-11-22 NOTE — Consult Note (Signed)
PATIENT NAME:  Gary Hahn, Gary Hahn MR#:  [id number] DATE OF BIRTH:  1940/11/01  DATE OF CONSULTATION:  09/30/2012  CONSULTING PHYSICIAN:  Reeda Soohoo K. Ashok Sawaya, MD  AGE: 74 years. SEX: Male. RACE: African American.  SUBJECTIVE: The patient was asked to be seen in consultation. Staff nurse reports that the patient was seen by Dr. Weber Cooks yesterday, 09/29/2012, and medications were adjusted and Risperdal was increased. Nurse reports that he has been needing p.r.n. Haldol all the time and still not calming down. The sitter reports that the patient has been combative all day and kicking and trying to get out of bed, trying to pull all the IVs and not cooperative to take the blood pressure. The patient lives with son and daughter-in-law and has a diagnosis of dementia.   OBJECTIVE: The patient was seen lying in his bed. The sitter reports that he knows his name, but he does not know where he is and does not know what he is doing and is not aware of his behavior and being combative. Today he is arousable, but he can barely say his name. Further mental status could not be done. Insight and judgment impaired.  IMPRESSION: Dementia, not otherwise specified, with behavioral problems.  RECOMMENDATION: Continue Haldol on p.r.n. basis. Ativan 1 mg p.o. q.6 hours p.r.n. for agitation to help control the same.    ____________________________ Wallace Cullens. Franchot Mimes, MD skc:jm D: 09/30/2012 18:17:02 ET T: 09/30/2012 18:45:23 ET JOB#: [id number]  cc: Arlyn Leak K. Franchot Mimes, MD, <Dictator> Dewain Penning MD ELECTRONICALLY SIGNED 10/01/2012 16:14

## 2014-11-22 NOTE — Consult Note (Signed)
Brief Consult Note: Diagnosis: abdominal pain of uncertain etiology.   Patient was seen by consultant.   Recommend further assessment or treatment.   Discussed with Attending MD.   Comments: Please see full GI consult to follow.  Chart reviewed and patient examined. P atient admitted with abdominal pain on 2/25 with acidosis, electrolyte abnormalities, concern initially for possible mesenteric insufficiency.  CT done on admission was non-contrasted due to renal insufficiency, possible dehydration.  Currently difficult historian, mostly due to age and concurrent morbidities.  Creatinine is improving.  Would pursue contrasted CT/CTA as renal function improves, re evaluation for mesenteric insufficiency,  as this will be a much better study in this regard.  Continue current, will follow. Discussed with Dr Caryl Comes.  Electronic Signatures: Loistine Simas (MD)  (Signed 28-Feb-14 21:52)  Authored: Brief Consult Note   Last Updated: 28-Feb-14 21:52 by Loistine Simas (MD)

## 2014-11-22 NOTE — H&P (Signed)
PATIENT NAME:  Gary Hahn, Gary Hahn MR#:  [id number] DATE OF BIRTH:  1940-12-21  DATE OF ADMISSION:  09/26/2012   NO DICTATION  ____________________________ Ceasar Lund. Anselm Jungling, MD vgv:cc D: 09/26/2012 21:09:45 ET T: 09/26/2012 22:07:57 ET JOB#: [id number]  cc: Ceasar Lund. Anselm Jungling, MD, <Dictator> Vaughan Basta MD ELECTRONICALLY SIGNED 10/01/2012 23:15

## 2014-11-24 NOTE — Op Note (Signed)
PATIENT NAME:  Gary Hahn, Gary Hahn MR#:  [id number] DATE OF BIRTH:  06-25-1941  DATE OF PROCEDURE:  10/12/2011  PREOPERATIVE DIAGNOSIS: Visually significant cataract of the right eye.   POSTOPERATIVE DIAGNOSIS: Visually significant cataract of the right eye.   OPERATIVE PROCEDURE: Cataract extraction by phacoemulsification with implant of intraocular lens to right eye.   SURGEON: Birder Robson, MD.   ANESTHESIA:  1. Managed anesthesia care.  2. Topical tetracaine drops followed by 2% Xylocaine jelly applied in the preoperative holding area.   COMPLICATIONS: None.   TECHNIQUE:  Stop and chop.  DESCRIPTION OF PROCEDURE: The patient was examined and consented in the preoperative holding area where the aforementioned topical anesthesia was applied to the right eye and then brought back to the Operating Room where the right eye was prepped and draped in the usual sterile ophthalmic fashion and a lid speculum was placed. A paracentesis was created with the side port blade and the anterior chamber was filled with viscoelastic. A near clear corneal incision was performed with the steel keratome. A continuous curvilinear capsulorrhexis was performed with a cystotome followed by the capsulorrhexis forceps. Hydrodissection and hydrodelineation were carried out with BSS on a blunt cannula. The lens was removed in a stop and chop technique and the remaining cortical material was removed with the irrigation-aspiration handpiece. The capsular bag was inflated with viscoelastic and the Technis ZCBOO 22.0-diopter lens, serial number [id number], was placed in the capsular bag without complication. The remaining viscoelastic was removed from the eye with the irrigation-aspiration handpiece. The wounds were hydrated. The anterior chamber was flushed with Miostat and the eye was inflated to physiologic pressure. The wounds were found to be water tight. The eye was dressed with Vigamox. The patient was given protective  glasses to wear throughout the day and a shield with which to sleep tonight. The patient was also given drops with which to begin a drop regimen today and will follow-up with me in one day.   ____________________________ Livingston Diones. Becky Berberian, MD wlp:cbb D: 10/12/2011 13:13:52 ET T: 10/12/2011 13:25:45 ET JOB#: [id number]  cc: Lisaanne Lawrie L. Jaylaa Gallion, MD, <Dictator> Livingston Diones Karmelo Bass MD ELECTRONICALLY SIGNED 10/13/2011 13:42

## 2014-11-25 DIAGNOSIS — R339 Retention of urine, unspecified: Secondary | ICD-10-CM | POA: Diagnosis not present

## 2014-11-25 DIAGNOSIS — N401 Enlarged prostate with lower urinary tract symptoms: Secondary | ICD-10-CM | POA: Diagnosis not present

## 2014-11-25 DIAGNOSIS — Z79899 Other long term (current) drug therapy: Secondary | ICD-10-CM | POA: Diagnosis not present

## 2014-11-25 DIAGNOSIS — I10 Essential (primary) hypertension: Secondary | ICD-10-CM | POA: Diagnosis not present

## 2014-11-25 DIAGNOSIS — R972 Elevated prostate specific antigen [PSA]: Secondary | ICD-10-CM | POA: Diagnosis not present

## 2014-11-25 DIAGNOSIS — R3914 Feeling of incomplete bladder emptying: Secondary | ICD-10-CM | POA: Diagnosis not present

## 2014-12-16 DIAGNOSIS — R972 Elevated prostate specific antigen [PSA]: Secondary | ICD-10-CM | POA: Diagnosis not present

## 2015-01-28 DIAGNOSIS — E2740 Unspecified adrenocortical insufficiency: Secondary | ICD-10-CM | POA: Diagnosis not present

## 2015-01-28 DIAGNOSIS — I10 Essential (primary) hypertension: Secondary | ICD-10-CM | POA: Diagnosis not present

## 2015-02-10 DIAGNOSIS — N289 Disorder of kidney and ureter, unspecified: Secondary | ICD-10-CM | POA: Diagnosis not present

## 2015-02-18 DIAGNOSIS — R972 Elevated prostate specific antigen [PSA]: Secondary | ICD-10-CM | POA: Diagnosis not present

## 2015-02-18 DIAGNOSIS — C61 Malignant neoplasm of prostate: Secondary | ICD-10-CM | POA: Diagnosis not present

## 2015-02-25 DIAGNOSIS — C61 Malignant neoplasm of prostate: Secondary | ICD-10-CM | POA: Diagnosis not present

## 2015-02-25 DIAGNOSIS — N320 Bladder-neck obstruction: Secondary | ICD-10-CM | POA: Diagnosis not present

## 2015-03-13 DIAGNOSIS — N289 Disorder of kidney and ureter, unspecified: Secondary | ICD-10-CM | POA: Diagnosis not present

## 2015-03-24 DIAGNOSIS — N179 Acute kidney failure, unspecified: Secondary | ICD-10-CM | POA: Diagnosis not present

## 2015-03-24 DIAGNOSIS — I10 Essential (primary) hypertension: Secondary | ICD-10-CM | POA: Diagnosis not present

## 2015-03-24 DIAGNOSIS — D631 Anemia in chronic kidney disease: Secondary | ICD-10-CM | POA: Diagnosis not present

## 2015-03-24 DIAGNOSIS — E2740 Unspecified adrenocortical insufficiency: Secondary | ICD-10-CM | POA: Diagnosis not present

## 2015-03-24 DIAGNOSIS — E871 Hypo-osmolality and hyponatremia: Secondary | ICD-10-CM | POA: Diagnosis not present

## 2015-03-26 DIAGNOSIS — N179 Acute kidney failure, unspecified: Secondary | ICD-10-CM | POA: Diagnosis not present

## 2015-04-02 DIAGNOSIS — N183 Chronic kidney disease, stage 3 (moderate): Secondary | ICD-10-CM | POA: Diagnosis not present

## 2015-04-02 DIAGNOSIS — D631 Anemia in chronic kidney disease: Secondary | ICD-10-CM | POA: Diagnosis not present

## 2015-04-02 DIAGNOSIS — N179 Acute kidney failure, unspecified: Secondary | ICD-10-CM | POA: Diagnosis not present

## 2015-04-02 DIAGNOSIS — I10 Essential (primary) hypertension: Secondary | ICD-10-CM | POA: Diagnosis not present

## 2015-05-13 DIAGNOSIS — I10 Essential (primary) hypertension: Secondary | ICD-10-CM | POA: Diagnosis not present

## 2015-05-13 DIAGNOSIS — N2581 Secondary hyperparathyroidism of renal origin: Secondary | ICD-10-CM | POA: Diagnosis not present

## 2015-05-13 DIAGNOSIS — N183 Chronic kidney disease, stage 3 (moderate): Secondary | ICD-10-CM | POA: Diagnosis not present

## 2015-05-27 DIAGNOSIS — N183 Chronic kidney disease, stage 3 (moderate): Secondary | ICD-10-CM | POA: Diagnosis not present

## 2015-05-27 DIAGNOSIS — I10 Essential (primary) hypertension: Secondary | ICD-10-CM | POA: Diagnosis not present

## 2015-05-27 DIAGNOSIS — N179 Acute kidney failure, unspecified: Secondary | ICD-10-CM | POA: Diagnosis not present

## 2015-05-29 DIAGNOSIS — I10 Essential (primary) hypertension: Secondary | ICD-10-CM | POA: Diagnosis not present

## 2015-05-29 DIAGNOSIS — N183 Chronic kidney disease, stage 3 (moderate): Secondary | ICD-10-CM | POA: Diagnosis not present

## 2015-05-29 DIAGNOSIS — D649 Anemia, unspecified: Secondary | ICD-10-CM | POA: Diagnosis not present

## 2015-06-02 DIAGNOSIS — M1990 Unspecified osteoarthritis, unspecified site: Secondary | ICD-10-CM | POA: Diagnosis not present

## 2015-06-02 DIAGNOSIS — I110 Hypertensive heart disease with heart failure: Secondary | ICD-10-CM | POA: Diagnosis not present

## 2015-06-02 DIAGNOSIS — R339 Retention of urine, unspecified: Secondary | ICD-10-CM | POA: Diagnosis not present

## 2015-06-02 DIAGNOSIS — R972 Elevated prostate specific antigen [PSA]: Secondary | ICD-10-CM | POA: Diagnosis not present

## 2015-06-02 DIAGNOSIS — R351 Nocturia: Secondary | ICD-10-CM | POA: Diagnosis not present

## 2015-06-02 DIAGNOSIS — I509 Heart failure, unspecified: Secondary | ICD-10-CM | POA: Diagnosis not present

## 2015-06-02 DIAGNOSIS — C61 Malignant neoplasm of prostate: Secondary | ICD-10-CM | POA: Diagnosis not present

## 2015-06-02 DIAGNOSIS — N320 Bladder-neck obstruction: Secondary | ICD-10-CM | POA: Diagnosis not present

## 2015-06-03 DIAGNOSIS — E2740 Unspecified adrenocortical insufficiency: Secondary | ICD-10-CM | POA: Diagnosis not present

## 2015-06-03 DIAGNOSIS — I10 Essential (primary) hypertension: Secondary | ICD-10-CM | POA: Diagnosis not present

## 2015-08-07 DIAGNOSIS — H2512 Age-related nuclear cataract, left eye: Secondary | ICD-10-CM | POA: Diagnosis not present

## 2015-09-25 DIAGNOSIS — I10 Essential (primary) hypertension: Secondary | ICD-10-CM | POA: Diagnosis not present

## 2015-09-25 DIAGNOSIS — D649 Anemia, unspecified: Secondary | ICD-10-CM | POA: Diagnosis not present

## 2015-09-25 DIAGNOSIS — N183 Chronic kidney disease, stage 3 (moderate): Secondary | ICD-10-CM | POA: Diagnosis not present

## 2015-10-02 DIAGNOSIS — N183 Chronic kidney disease, stage 3 (moderate): Secondary | ICD-10-CM | POA: Diagnosis not present

## 2015-10-02 DIAGNOSIS — D649 Anemia, unspecified: Secondary | ICD-10-CM | POA: Diagnosis not present

## 2015-10-02 DIAGNOSIS — I10 Essential (primary) hypertension: Secondary | ICD-10-CM | POA: Diagnosis not present

## 2016-04-01 DIAGNOSIS — N183 Chronic kidney disease, stage 3 (moderate): Secondary | ICD-10-CM | POA: Diagnosis not present

## 2016-04-01 DIAGNOSIS — I10 Essential (primary) hypertension: Secondary | ICD-10-CM | POA: Diagnosis not present

## 2016-04-01 DIAGNOSIS — D649 Anemia, unspecified: Secondary | ICD-10-CM | POA: Diagnosis not present

## 2016-04-08 DIAGNOSIS — I10 Essential (primary) hypertension: Secondary | ICD-10-CM | POA: Diagnosis not present

## 2016-04-08 DIAGNOSIS — D649 Anemia, unspecified: Secondary | ICD-10-CM | POA: Diagnosis not present

## 2016-05-28 DIAGNOSIS — I10 Essential (primary) hypertension: Secondary | ICD-10-CM | POA: Diagnosis not present

## 2016-06-07 DIAGNOSIS — E2740 Unspecified adrenocortical insufficiency: Secondary | ICD-10-CM | POA: Diagnosis not present

## 2016-09-30 DIAGNOSIS — D649 Anemia, unspecified: Secondary | ICD-10-CM | POA: Diagnosis not present

## 2016-09-30 DIAGNOSIS — I10 Essential (primary) hypertension: Secondary | ICD-10-CM | POA: Diagnosis not present

## 2016-10-07 DIAGNOSIS — Z0000 Encounter for general adult medical examination without abnormal findings: Secondary | ICD-10-CM | POA: Diagnosis not present

## 2016-10-07 DIAGNOSIS — I10 Essential (primary) hypertension: Secondary | ICD-10-CM | POA: Diagnosis not present

## 2016-10-07 DIAGNOSIS — D649 Anemia, unspecified: Secondary | ICD-10-CM | POA: Diagnosis not present

## 2016-11-30 DEATH — deceased
